# Patient Record
Sex: Female | Born: 1985 | Race: Black or African American | Hispanic: No | Smoking: Current every day smoker
Health system: Southern US, Community
[De-identification: ages and names within clinical notes are randomized; demographics above are authoritative.]

## PROBLEM LIST (undated history)

## (undated) ENCOUNTER — Inpatient Hospital Stay (HOSPITAL_COMMUNITY): Payer: Self-pay

## (undated) DIAGNOSIS — K219 Gastro-esophageal reflux disease without esophagitis: Secondary | ICD-10-CM

## (undated) DIAGNOSIS — F32A Depression, unspecified: Secondary | ICD-10-CM

## (undated) DIAGNOSIS — F329 Major depressive disorder, single episode, unspecified: Secondary | ICD-10-CM

## (undated) DIAGNOSIS — D573 Sickle-cell trait: Secondary | ICD-10-CM

---
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
## 2010-05-20 ENCOUNTER — Emergency Department (HOSPITAL_COMMUNITY)
Admission: EM | Admit: 2010-05-20 | Discharge: 2010-05-20 | Payer: Self-pay | Source: Home / Self Care | Admitting: Emergency Medicine

## 2011-01-06 ENCOUNTER — Emergency Department (HOSPITAL_COMMUNITY)
Admission: EM | Admit: 2011-01-06 | Discharge: 2011-01-06 | Disposition: A | Discharge status: Home / Self Care | Payer: Medicaid Other | Attending: Emergency Medicine | Admitting: Emergency Medicine

## 2011-01-06 DIAGNOSIS — M545 Low back pain, unspecified: Secondary | ICD-10-CM | POA: Insufficient documentation

## 2011-01-06 DIAGNOSIS — M79609 Pain in unspecified limb: Secondary | ICD-10-CM | POA: Insufficient documentation

## 2011-01-06 DIAGNOSIS — G8929 Other chronic pain: Secondary | ICD-10-CM | POA: Insufficient documentation

## 2011-01-15 ENCOUNTER — Emergency Department (HOSPITAL_COMMUNITY)
Admission: EM | Admit: 2011-01-15 | Discharge: 2011-01-15 | Disposition: A | Discharge status: Home / Self Care | Payer: Medicaid Other | Attending: Emergency Medicine | Admitting: Emergency Medicine

## 2011-01-15 DIAGNOSIS — K089 Disorder of teeth and supporting structures, unspecified: Secondary | ICD-10-CM | POA: Insufficient documentation

## 2011-01-15 DIAGNOSIS — K029 Dental caries, unspecified: Secondary | ICD-10-CM | POA: Insufficient documentation

## 2011-05-20 ENCOUNTER — Emergency Department (HOSPITAL_COMMUNITY)
Admission: EM | Admit: 2011-05-20 | Discharge: 2011-05-20 | Discharge status: Against Medical Advice | Payer: Medicaid Other | Attending: Emergency Medicine | Admitting: Emergency Medicine

## 2011-05-20 ENCOUNTER — Encounter: Payer: Self-pay | Admitting: Emergency Medicine

## 2011-05-20 DIAGNOSIS — R110 Nausea: Secondary | ICD-10-CM | POA: Insufficient documentation

## 2011-05-20 LAB — URINALYSIS, ROUTINE W REFLEX MICROSCOPIC
Bilirubin Urine: NEGATIVE
Glucose, UA: NEGATIVE mg/dL
Hgb urine dipstick: NEGATIVE
Specific Gravity, Urine: 1.012 (ref 1.005–1.030)
pH: 6.5 (ref 5.0–8.0)

## 2011-05-20 LAB — POCT PREGNANCY, URINE: Preg Test, Ur: POSITIVE

## 2011-05-20 NOTE — ED Notes (Signed)
Pt c/o nausea this am and wants to be checked to see if she is pregnant; pt sts LMP was 04/12/2011

## 2011-05-26 NOTE — L&D Delivery Note (Signed)
Delivery Note At  a viable female was delivered via precipitous NSVD (Presentation: LOA ;  ).  APGAR: 9, 9; weight pending.   Placenta status: spontaneous, intact.  Cord: 3-vessel cord with the following complications: none.  Cord pH: n/a  Anesthesia:  none Episiotomy: n/a Lacerations: none Suture Repair: n/a Est. Blood Loss (mL): 300  Mom to postpartum.  Baby to nursery-stable.  Maylon Cos, CNM was present for delivery.  BOOTH, Deondra Wigger 01/04/2012, 1:43 AM

## 2011-05-26 NOTE — L&D Delivery Note (Signed)
I was present and agree with the above. Camdon Saetern E.  

## 2011-07-20 ENCOUNTER — Other Ambulatory Visit (HOSPITAL_COMMUNITY): Payer: Self-pay | Admitting: Family

## 2011-07-20 DIAGNOSIS — Z3689 Encounter for other specified antenatal screening: Secondary | ICD-10-CM

## 2011-07-20 LAB — OB RESULTS CONSOLE HIV ANTIBODY (ROUTINE TESTING): HIV: NONREACTIVE

## 2011-07-20 LAB — OB RESULTS CONSOLE GC/CHLAMYDIA
Chlamydia: NEGATIVE
Gonorrhea: NEGATIVE

## 2011-07-22 ENCOUNTER — Other Ambulatory Visit (HOSPITAL_COMMUNITY): Payer: Self-pay | Admitting: Family

## 2011-07-22 ENCOUNTER — Encounter (HOSPITAL_COMMUNITY): Payer: Self-pay

## 2011-07-22 ENCOUNTER — Ambulatory Visit (HOSPITAL_COMMUNITY)
Admission: RE | Admit: 2011-07-22 | Discharge: 2011-07-22 | Disposition: A | Discharge status: Home / Self Care | Payer: Medicaid Other | Source: Ambulatory Visit | Attending: Family | Admitting: Family

## 2011-07-22 DIAGNOSIS — Z3689 Encounter for other specified antenatal screening: Secondary | ICD-10-CM

## 2011-07-29 ENCOUNTER — Other Ambulatory Visit (HOSPITAL_COMMUNITY): Payer: Self-pay | Admitting: Family

## 2011-07-29 DIAGNOSIS — Z3689 Encounter for other specified antenatal screening: Secondary | ICD-10-CM

## 2011-08-12 ENCOUNTER — Ambulatory Visit (HOSPITAL_COMMUNITY)
Admission: RE | Admit: 2011-08-12 | Discharge: 2011-08-12 | Disposition: A | Discharge status: Home / Self Care | Payer: Medicare Other | Source: Ambulatory Visit | Attending: Family | Admitting: Family

## 2011-08-12 DIAGNOSIS — Z1389 Encounter for screening for other disorder: Secondary | ICD-10-CM | POA: Insufficient documentation

## 2011-08-12 DIAGNOSIS — O358XX0 Maternal care for other (suspected) fetal abnormality and damage, not applicable or unspecified: Secondary | ICD-10-CM | POA: Insufficient documentation

## 2011-08-12 DIAGNOSIS — Z3689 Encounter for other specified antenatal screening: Secondary | ICD-10-CM

## 2011-08-12 DIAGNOSIS — Z363 Encounter for antenatal screening for malformations: Secondary | ICD-10-CM | POA: Insufficient documentation

## 2011-09-27 ENCOUNTER — Inpatient Hospital Stay (HOSPITAL_COMMUNITY)
Admission: AD | Admit: 2011-09-27 | Discharge: 2011-09-27 | Disposition: A | Discharge status: Home / Self Care | Payer: Medicare Other | Attending: Family Medicine | Admitting: Family Medicine

## 2011-09-27 ENCOUNTER — Encounter (HOSPITAL_COMMUNITY): Payer: Self-pay

## 2011-09-27 DIAGNOSIS — O26899 Other specified pregnancy related conditions, unspecified trimester: Secondary | ICD-10-CM

## 2011-09-27 DIAGNOSIS — M549 Dorsalgia, unspecified: Secondary | ICD-10-CM

## 2011-09-27 DIAGNOSIS — M545 Low back pain, unspecified: Secondary | ICD-10-CM | POA: Insufficient documentation

## 2011-09-27 DIAGNOSIS — R109 Unspecified abdominal pain: Secondary | ICD-10-CM | POA: Insufficient documentation

## 2011-09-27 DIAGNOSIS — O368190 Decreased fetal movements, unspecified trimester, not applicable or unspecified: Secondary | ICD-10-CM | POA: Insufficient documentation

## 2011-09-27 DIAGNOSIS — O99891 Other specified diseases and conditions complicating pregnancy: Secondary | ICD-10-CM | POA: Insufficient documentation

## 2011-09-27 HISTORY — DX: Sickle-cell trait: D57.3

## 2011-09-27 HISTORY — DX: Gastro-esophageal reflux disease without esophagitis: K21.9

## 2011-09-27 HISTORY — DX: Depression, unspecified: F32.A

## 2011-09-27 HISTORY — DX: Major depressive disorder, single episode, unspecified: F32.9

## 2011-09-27 LAB — URINALYSIS, ROUTINE W REFLEX MICROSCOPIC
Ketones, ur: NEGATIVE mg/dL
Nitrite: NEGATIVE
Specific Gravity, Urine: 1.02 (ref 1.005–1.030)
pH: 6 (ref 5.0–8.0)

## 2011-09-27 LAB — URINE MICROSCOPIC-ADD ON

## 2011-09-27 MED ORDER — CYCLOBENZAPRINE HCL 10 MG PO TABS: 10.0000 mg | ORAL_TABLET | Freq: Three times a day (TID) | ORAL | Status: AC | PRN

## 2011-09-27 NOTE — Discharge Instructions (Signed)

## 2011-09-27 NOTE — MAU Provider Note (Signed)
  History     CSN: [id number]  Arrival date and time: 09/27/11 0108   First Provider Initiated Contact with Patient 09/27/11 [phone]      Chief Complaint  Patient presents with  . Abdominal Pain   HPI  Pt here with report of lower back pain that radiated to lower left side.  Denies vaginal bleeding or contractions.  Pain is no longer present.  Also, concerned because of decreased fetal movement.  Feeling movement since arrival.    Past Medical History  Diagnosis Date  . Asthma   . Sickle cell trait   . Abnormal Pap smear   . Depression   . GERD (gastroesophageal reflux disease)     History reviewed. No pertinent past surgical history.  History reviewed. No pertinent family history.  History  Substance Use Topics  . Smoking status: Current Everyday Smoker  . Smokeless tobacco: Not on file  . Alcohol Use: No    Allergies: No Known Allergies  Prescriptions prior to admission  Medication Sig Dispense Refill  . Prenatal Vit-Fe Fumarate-FA (PRENATAL MULTIVITAMIN) TABS Take 1 tablet by mouth daily.        Review of Systems  Gastrointestinal: Positive for abdominal pain.  Musculoskeletal: Positive for back pain.  All other systems reviewed and are negative.   Physical Exam   Blood pressure 109/44, pulse 72, temperature 98.5 F (36.9 C), temperature source Oral, resp. rate 18.  Physical Exam  Constitutional: She is oriented to person, place, and time. She appears well-developed and well-nourished. No distress.  HENT:  Head: Normocephalic.  Neck: Normal range of motion. Neck supple.  Cardiovascular: Normal rate, regular rhythm and normal heart sounds.   Respiratory: Effort normal and breath sounds normal. No respiratory distress.  GI: Soft. There is no tenderness.  Genitourinary: No bleeding around the vagina. Vaginal discharge (mucusy) found.  Neurological: She is alert and oriented to person, place, and time.  Skin: Skin is warm and dry.  Cervix - closed  MAU  Course  Procedures  Results for orders placed during the hospital encounter of 09/27/11 (from the past 24 hour(s))  URINALYSIS, ROUTINE W REFLEX MICROSCOPIC     Status: Abnormal   Collection Time   09/27/11  1:10 AM      Component Value Range   Color, Urine YELLOW  YELLOW    APPearance CLEAR  CLEAR    Specific Gravity, Urine 1.020  1.005 - 1.030    pH 6.0  5.0 - 8.0    Glucose, UA NEGATIVE  NEGATIVE (mg/dL)   Hgb urine dipstick NEGATIVE  NEGATIVE    Bilirubin Urine NEGATIVE  NEGATIVE    Ketones, ur NEGATIVE  NEGATIVE (mg/dL)   Protein, ur NEGATIVE  NEGATIVE (mg/dL)   Urobilinogen, UA 0.2  0.0 - 1.0 (mg/dL)   Nitrite NEGATIVE  NEGATIVE    Leukocytes, UA TRACE (*) NEGATIVE   URINE MICROSCOPIC-ADD ON     Status: Normal   Collection Time   09/27/11  1:10 AM      Component Value Range   Squamous Epithelial / LPF RARE  RARE    WBC, UA 3-6  <3 (WBC/hpf)   Bacteria, UA RARE  RARE    FHR 140's  Assessment and Plan  Normal Examination Reassuring Fetal Heart Rate  Plan: DC to home Preterm labor precautions RX Flexeril for back pain  [facility] 09/27/2011, 2:07 AM

## 2011-09-27 NOTE — MAU Provider Note (Signed)
Chart reviewed and agree with management and plan.  

## 2011-09-27 NOTE — MAU Note (Signed)
Patient is here with c/o lower back and llq pain. She denies any vaginal bleeding or lof. She reports decreased fetal movement

## 2011-11-27 ENCOUNTER — Inpatient Hospital Stay (HOSPITAL_COMMUNITY)
Admission: AD | Admit: 2011-11-27 | Discharge: 2011-11-28 | Disposition: A | Discharge status: Home / Self Care | Payer: Medicaid Other | Source: Ambulatory Visit | Attending: Obstetrics & Gynecology | Admitting: Obstetrics & Gynecology

## 2011-11-27 DIAGNOSIS — O2390 Unspecified genitourinary tract infection in pregnancy, unspecified trimester: Secondary | ICD-10-CM | POA: Insufficient documentation

## 2011-11-27 DIAGNOSIS — L539 Erythematous condition, unspecified: Secondary | ICD-10-CM | POA: Insufficient documentation

## 2011-11-27 DIAGNOSIS — R32 Unspecified urinary incontinence: Secondary | ICD-10-CM | POA: Insufficient documentation

## 2011-11-27 DIAGNOSIS — B373 Candidiasis of vulva and vagina: Secondary | ICD-10-CM

## 2011-11-27 DIAGNOSIS — O99891 Other specified diseases and conditions complicating pregnancy: Secondary | ICD-10-CM | POA: Insufficient documentation

## 2011-11-27 DIAGNOSIS — M549 Dorsalgia, unspecified: Secondary | ICD-10-CM

## 2011-11-27 DIAGNOSIS — B9689 Other specified bacterial agents as the cause of diseases classified elsewhere: Secondary | ICD-10-CM | POA: Insufficient documentation

## 2011-11-27 DIAGNOSIS — M545 Low back pain, unspecified: Secondary | ICD-10-CM | POA: Insufficient documentation

## 2011-11-27 DIAGNOSIS — R238 Other skin changes: Secondary | ICD-10-CM

## 2011-11-27 DIAGNOSIS — A499 Bacterial infection, unspecified: Secondary | ICD-10-CM | POA: Insufficient documentation

## 2011-11-27 DIAGNOSIS — N760 Acute vaginitis: Secondary | ICD-10-CM | POA: Insufficient documentation

## 2011-11-28 ENCOUNTER — Encounter (HOSPITAL_COMMUNITY): Payer: Self-pay | Admitting: Family Medicine

## 2011-11-28 DIAGNOSIS — O26899 Other specified pregnancy related conditions, unspecified trimester: Secondary | ICD-10-CM

## 2011-11-28 DIAGNOSIS — M549 Dorsalgia, unspecified: Secondary | ICD-10-CM

## 2011-11-28 LAB — WET PREP, GENITAL: Trich, Wet Prep: NONE SEEN

## 2011-11-28 MED ORDER — FLUCONAZOLE 150 MG PO TABS: 150.0000 mg | ORAL_TABLET | Freq: Once | ORAL | Status: AC

## 2011-11-28 MED ORDER — METRONIDAZOLE 500 MG PO TABS: 500.0000 mg | ORAL_TABLET | Freq: Two times a day (BID) | ORAL | Status: AC

## 2011-11-28 MED ORDER — CYCLOBENZAPRINE HCL 10 MG PO TABS: 10.0000 mg | ORAL_TABLET | Freq: Two times a day (BID) | ORAL | Status: AC | PRN

## 2011-11-28 NOTE — MAU Note (Signed)
During the day sometimes I leak fld. Have rash around rectum. Unsure if I'm sweating or leaking fld. This has been going on 3-4wks. Some lower back

## 2011-11-28 NOTE — MAU Provider Note (Signed)
History  CSN: [id number] Arrival date and time: 11/27/11 2354  Chief Complaint  Patient presents with  . Rupture of Membranes  . Back Pain   HPI Patient is a 26 yo woman, [redacted]w[redacted]d by LMP, [id number] who presents with right lower back pain, diffuse aching abdominal pain that has progressed with continuation of pregnancy, a rash in her upper inner thigh and vaginal area and complaints of a slow, clear vaginal discharge over the past few days that worsens when she coughs. Patient reports that she has not had pain like this before with other pregnancies and it is very uncomfortable. She was given a prescription for a muscle relaxer from the HD however she never filled it. She also notes that she was shaving her vaginal area yesterday and began developing a rash that was tender to the touch. She cannot see it however her partner reports it looks like a diaper rash. Patient denies any changes in soaps, new lotions / creams, no new medications. The patient has urinary frequency about every 30-40 min. and notes a small, clear discharge for which she has been wearing a pad for lately, however it "does not smell like urine." She also reports that the leak increases when she coughs. She denies any fevers, chills, night sweats, N/V/D, constipation. She receives her prenatal care at [facility].    OB History    Grav Para Term Preterm Abortions TAB SAB Ect Mult Living   3 2 2       2       Past Medical History  Diagnosis Date  . Asthma   . Sickle cell trait   . Abnormal Pap smear   . Depression   . GERD (gastroesophageal reflux disease)     No past surgical history on file.  No family history on file.  History  Substance Use Topics  . Smoking status: Former Games developer  . Smokeless tobacco: Not on file  . Alcohol Use: No    Allergies: No Known Allergies  Prescriptions prior to admission  Medication Sig Dispense Refill  . Prenatal Vit-Fe Fumarate-FA (PRENATAL MULTIVITAMIN) TABS Take 1 tablet by mouth daily.         ROS   General: negative for - chills, fever or night sweats Psychological ROS: positive for - depression Ophthalmic ROS: negative ENT ROS: negative Allergy and Immunology ROS: negative for - hives or itchy/watery eyes Hematological and Lymphatic ROS: negative for - bleeding problems or night sweats Endocrine ROS: negative Respiratory ROS: no cough, shortness of breath, or wheezing Cardiovascular ROS: no chest pain or dyspnea on exertion Gastrointestinal ROS: positive for - abdominal pain Genito-Urinary ROS: positive for - incontinence Musculoskeletal ROS: negative Neurological ROS: no TIA or stroke symptoms Dermatological ROS: right paralabial erythema   Blood pressure 101/52, pulse 72, temperature 98.5 F (36.9 C), temperature source Oral, resp. rate 20, height 5\' 7"  (1.702 m), weight 114.306 kg (252 lb).  Physical Exam General appearance - alert, well appearing, and in no distress Mental status - alert, oriented to person, place, and time Eyes - extraocular eye movements intact Mouth - mucous membranes moist, pharynx normal without lesions Chest - clear to auscultation, no wheezes, rales or rhonchi, symmetric air entry Heart - normal rate, regular rhythm, normal S1, S2, no murmurs, rubs, clicks or gallops Abdomen - soft, nontender, nondistended, no masses or organomegaly Pelvic - white vaginal discharge, normal external genitalia Back exam - full range of motion, no tenderness, palpable spasm or pain on motion Neurological - alert, oriented,  normal speech, no focal findings or movement disorder noted Musculoskeletal - no joint tenderness, deformity or swelling Extremities - pedal edema 1 + B/L LE Skin - right paralabial erythema  Results for orders placed during the hospital encounter of 11/27/11 (from the past 24 hour(s))  WET PREP, GENITAL     Status: Abnormal   Collection Time   11/28/11  1:25 AM      Component Value Range   Yeast Wet Prep HPF POC FEW (*) NONE  SEEN   Trich, Wet Prep NONE SEEN  NONE SEEN   Clue Cells Wet Prep HPF POC FEW (*) NONE SEEN   WBC, Wet Prep HPF POC FEW (*) NONE SEEN   MAU Course  Procedures - sterile spec exam - wet prep test   MDM - discussed findings of wet prep with patient - no s/s of ROM seen on exam, no pooling  Assessment  1) Lower back pain of pregnancy 2) Genital erythema 2/2 razor burn 3) Urinary incontinence - no sings of ROM, no pooling 4) Mucous, white vaginal discharge  Plan: 1) Flexeril prn for back pain 2) Continue vaseline for razor burn 3) Flagyl for BV 4) Fluconazole for Candida 5) Continue prenatal care with Theone Murdoch 11/28/2011, 1:15 AM   Patient seen and examined in conjunction with Lewie Chamber, MS3.  Agree with above note.  NST - category 1 tracing with multiple accelerations seen.  Levie Heritage, DO 11/28/2011 1:54 AM

## 2011-12-25 LAB — OB RESULTS CONSOLE GBS: GBS: NEGATIVE

## 2012-01-02 ENCOUNTER — Inpatient Hospital Stay (HOSPITAL_COMMUNITY)
Admission: AD | Admit: 2012-01-02 | Discharge: 2012-01-02 | DRG: 780 | Disposition: A | Discharge status: Home / Self Care | Payer: Medicare Other | Source: Ambulatory Visit | Attending: Obstetrics & Gynecology | Admitting: Obstetrics & Gynecology

## 2012-01-02 ENCOUNTER — Encounter (HOSPITAL_COMMUNITY): Payer: Self-pay | Admitting: *Deleted

## 2012-01-02 DIAGNOSIS — IMO0001 Reserved for inherently not codable concepts without codable children: Secondary | ICD-10-CM

## 2012-01-02 DIAGNOSIS — O479 False labor, unspecified: Principal | ICD-10-CM | POA: Diagnosis present

## 2012-01-02 LAB — CBC
HCT: 35.1 % — ABNORMAL LOW (ref 36.0–46.0)
Hemoglobin: 12.2 g/dL (ref 12.0–15.0)
WBC: 10.7 10*3/uL — ABNORMAL HIGH (ref 4.0–10.5)

## 2012-01-02 MED ORDER — OXYTOCIN 40 UNITS IN LACTATED RINGERS INFUSION - SIMPLE MED: 62.5000 mL/h | Freq: Once | INTRAVENOUS | Status: DC

## 2012-01-02 MED ORDER — FENTANYL CITRATE 0.05 MG/ML IJ SOLN
100.0000 ug | Freq: Once | INTRAMUSCULAR | Status: AC
  Administered 2012-01-02: 100 ug via INTRAVENOUS
  Filled 2012-01-02: qty 2

## 2012-01-02 MED ORDER — OXYTOCIN BOLUS FROM INFUSION
250.0000 mL | Freq: Once | INTRAVENOUS | Status: DC
  Filled 2012-01-02: qty 500

## 2012-01-02 MED ORDER — EPHEDRINE 5 MG/ML INJ: 10.0000 mg | INTRAVENOUS | Status: DC | PRN

## 2012-01-02 MED ORDER — FENTANYL 2.5 MCG/ML BUPIVACAINE 1/10 % EPIDURAL INFUSION (WH - ANES): 14.0000 mL/h | INTRAMUSCULAR | Status: DC

## 2012-01-02 MED ORDER — ONDANSETRON HCL 4 MG/2ML IJ SOLN: 4.0000 mg | Freq: Four times a day (QID) | INTRAMUSCULAR | Status: DC | PRN

## 2012-01-02 MED ORDER — OXYCODONE-ACETAMINOPHEN 5-325 MG PO TABS: 1.0000 | ORAL_TABLET | ORAL | Status: DC | PRN

## 2012-01-02 MED ORDER — PHENYLEPHRINE 40 MCG/ML (10ML) SYRINGE FOR IV PUSH (FOR BLOOD PRESSURE SUPPORT): 80.0000 ug | PREFILLED_SYRINGE | INTRAVENOUS | Status: DC | PRN

## 2012-01-02 MED ORDER — ACETAMINOPHEN 325 MG PO TABS: 650.0000 mg | ORAL_TABLET | ORAL | Status: DC | PRN

## 2012-01-02 MED ORDER — LIDOCAINE HCL (PF) 1 % IJ SOLN: 30.0000 mL | INTRAMUSCULAR | Status: DC | PRN

## 2012-01-02 MED ORDER — LACTATED RINGERS IV SOLN
500.0000 mL | INTRAVENOUS | Status: DC | PRN
Start: 2012-01-02 — End: 2012-01-02
  Administered 2012-01-02: 1000 mL via INTRAVENOUS

## 2012-01-02 MED ORDER — CITRIC ACID-SODIUM CITRATE 334-500 MG/5ML PO SOLN: 30.0000 mL | ORAL | Status: DC | PRN

## 2012-01-02 MED ORDER — DIPHENHYDRAMINE HCL 50 MG/ML IJ SOLN: 12.5000 mg | INTRAMUSCULAR | Status: DC | PRN

## 2012-01-02 MED ORDER — LACTATED RINGERS IV SOLN: INTRAVENOUS | Status: DC

## 2012-01-02 MED ORDER — IBUPROFEN 600 MG PO TABS: 600.0000 mg | ORAL_TABLET | Freq: Four times a day (QID) | ORAL | Status: DC | PRN

## 2012-01-02 MED ORDER — FLEET ENEMA 7-19 GM/118ML RE ENEM: 1.0000 | ENEMA | RECTAL | Status: DC | PRN

## 2012-01-02 MED ORDER — LACTATED RINGERS IV SOLN: 500.0000 mL | Freq: Once | INTRAVENOUS | Status: DC

## 2012-01-02 NOTE — Plan of Care (Signed)
Problem: Phase I Progression Outcomes Goal: Discharge home if all goals are met Outcome: Not Progressing Pt discharged due to lack of cervical change and resolution of contractions per Dr. Doreene Nest and Dr. Jacalyn Lefevre.Marland Kitchen

## 2012-01-02 NOTE — Progress Notes (Signed)
Samantha Bradford is a 26 y.o. G3P2002 at [redacted]w[redacted]d admitted for labor  Subjective:   Objective: BP 114/72  Pulse 69  Temp 98.2 F (36.8 C) (Oral)  Resp 18  Ht 5\' 5"  (1.651 m)  Wt 113.399 kg (250 lb)  BMI 41.60 kg/m2      FHT:  FHR: 130 bpm, variability: moderate,  accelerations:  Present,  decelerations:  Absent UC:   irregular, every 4-7 minutes SVE:   5-6/70/-2 Labs: No results found for this basename: WBC, HGB, HCT, MCV, PLT    Assessment / Plan: spontaneous labor, contractions not frequent but pt almost 6 cm.  Labor: Progressing normally Preeclampsia:  no signs or symptoms of toxicity Fetal Wellbeing:  Category I Pain Control:  Fentanyl I/D:  n/a Anticipated MOD:  NSVD  LEGGETT,KELLY H. 01/02/2012, 4:10 PM

## 2012-01-02 NOTE — H&P (Signed)
I examined pt and agree with documentation above and resident plan of care. MUHAMMAD,Rubby Barbary  

## 2012-01-02 NOTE — Discharge Summary (Signed)
Pt seen and examined. Agree with above note.  Khoi Hamberger H. 01/02/2012 9:08 PM

## 2012-01-02 NOTE — H&P (Signed)
Samantha Bradford is a 26 y.o. female presenting for contractions x3 days, felt in bottom. Maternal Medical History:  Reason for admission: Reason for admission: contractions.  Reason for Admission:   nauseaContractions: Onset was more than 2 days ago.   Frequency: regular.   Perceived severity is strong.    Fetal activity: Perceived fetal activity is normal.   Last perceived fetal movement was within the past hour.    Prenatal complications: Substance abuse (marijuana and cigarettes in 1st trimester).   No bleeding, hypertension or preterm labor.   Prenatal Complications - Diabetes: none.    OB History    Grav Para Term Preterm Abortions TAB SAB Ect Mult Living   3 2 2       2      Past Medical History  Diagnosis Date  . Asthma   . Sickle cell trait   . Abnormal Pap smear   . Depression   . GERD (gastroesophageal reflux disease)    History reviewed. No pertinent past surgical history. Family History: family history is not on file. Social History:  reports that she has quit smoking. She does not have any smokeless tobacco history on file. She reports that she uses illicit drugs (Marijuana). She reports that she does not drink alcohol.   Prenatal Transfer Tool  Maternal Diabetes: No Genetic Screening: Normal Maternal Ultrasounds/Referrals: Normal Fetal Ultrasounds or other Referrals:  None Maternal Substance Abuse:  Yes:  Type: Smoker, Marijuana - in first trimester only Significant Maternal Medications:  None Significant Maternal Lab Results:  Lab values include: Group B Strep negative Other Comments:  None  Review of Systems  Constitutional: Negative for fever and chills.  Respiratory: Negative for cough and shortness of breath.   Cardiovascular: Negative for chest pain.  Gastrointestinal: Negative for nausea and vomiting.  Neurological: Negative for dizziness and headaches.    Dilation: 5 Effacement (%): 80 Station: -3 Exam by:: Elwyn Reach MD Blood pressure  100/59, pulse 82, temperature 98.2 F (36.8 C), resp. rate 18, height 5\' 6"  (1.676 m), weight 250 lb 3.2 oz (113.49 kg). Maternal Exam:  Uterine Assessment: Contraction strength is moderate.  Abdomen: Patient reports no abdominal tenderness. Fundal height is appropriate for dates.   Estimated fetal weight is 8.   Fetal presentation: vertex  Introitus: Normal vulva. Normal vagina.  Pelvis: adequate for delivery.   Cervix: Cervix evaluated by digital exam.     Fetal Exam Fetal Monitor Review: Mode: ultrasound.   Baseline rate: 135.  Variability: moderate (6-25 bpm).   Pattern: accelerations present and no decelerations.    Fetal State Assessment: Category I - tracings are normal.     Physical Exam  Constitutional: She is oriented to person, place, and time. She appears well-developed and well-nourished. She appears distressed (with contractions).  HENT:  Head: Normocephalic and atraumatic.  Eyes: No scleral icterus.  Neck: Normal range of motion. Neck supple.  Cardiovascular: Normal rate.   Respiratory: Effort normal.  GI:       Gravid  Musculoskeletal: She exhibits no edema and no tenderness.  Neurological: She is alert and oriented to person, place, and time. She exhibits normal muscle tone.  Skin: Skin is warm and dry. No erythema.  Psychiatric: She has a normal mood and affect. Her behavior is normal.    Prenatal labs: ABO, Rh: O/Positive/-- (02/25 0000) Antibody:   neg Rubella:  immune RPR:   negative HBsAg:   negative HIV:   negative GBS:   negative 1hr GTT: 100  Assessment/Plan:  26 yo G3P2002 in active labor - admit to L&D - GBS negative - plans on epidural - provide labor support   BOOTH, ERIN 01/02/2012, 3:02 PM

## 2012-01-02 NOTE — Discharge Summary (Signed)
Obstetric Discharge Summary Samantha Bradford is a 26 yo G3P2002 at [redacted]w[redacted]d who presented to MAU with contractions.  Reason for Admission: contractions Prenatal Procedures: ultrasound Hospital course: Patient was admitted after cervix changed from 4 to 5 cm.  She was started on IV fluids and her contractions stopped.  After 3 hours of monitoring, her contractions did not resume and her cervix remained 5-5.5cm and posterior.  She was discharged with labor precautions and instructions to stay well hydrated.  Discharge Diagnosis: Braxton-Hicks contractions Discharge condition: Stable  Discharge Information: Date: 01/02/2012 Activity: unrestricted Diet: routine Medications: PNV Condition: stable Instructions: labor precautions and hydration Discharge to: home   BOOTH, Gailene Youkhana 01/02/2012, 7:31 PM

## 2012-01-02 NOTE — MAU Note (Signed)
Pt reports  She has been leaking clear fluid foraa few days and having "strong contractions" also. About 3-8 min apart. Pt told she was 5 cm dilated on Thursday.

## 2012-01-04 ENCOUNTER — Encounter (HOSPITAL_COMMUNITY): Payer: Self-pay | Admitting: *Deleted

## 2012-01-04 ENCOUNTER — Inpatient Hospital Stay (HOSPITAL_COMMUNITY)
Admission: AD | Admit: 2012-01-04 | Discharge: 2012-01-05 | DRG: 775 | Disposition: A | Discharge status: Home / Self Care | Payer: Medicare Other | Source: Ambulatory Visit | Attending: Obstetrics & Gynecology | Admitting: Obstetrics & Gynecology

## 2012-01-04 LAB — CBC
MCH: 27.9 pg (ref 26.0–34.0)
MCHC: 34.9 g/dL (ref 30.0–36.0)
Platelets: 221 10*3/uL (ref 150–400)
RDW: 13.4 % (ref 11.5–15.5)

## 2012-01-04 MED ORDER — SENNOSIDES-DOCUSATE SODIUM 8.6-50 MG PO TABS
2.0000 | ORAL_TABLET | Freq: Every day | ORAL | Status: DC
  Administered 2012-01-04: 2 via ORAL

## 2012-01-04 MED ORDER — OXYTOCIN BOLUS FROM INFUSION
250.0000 mL | Freq: Once | INTRAVENOUS | Status: DC
  Filled 2012-01-04: qty 500

## 2012-01-04 MED ORDER — PRENATAL MULTIVITAMIN CH
1.0000 | ORAL_TABLET | Freq: Every day | ORAL | Status: DC
  Administered 2012-01-04 – 2012-01-05 (×2): 1 via ORAL
  Filled 2012-01-04 (×2): qty 1

## 2012-01-04 MED ORDER — LACTATED RINGERS IV SOLN: INTRAVENOUS | Status: DC

## 2012-01-04 MED ORDER — ZOLPIDEM TARTRATE 5 MG PO TABS: 5.0000 mg | ORAL_TABLET | Freq: Every evening | ORAL | Status: DC | PRN

## 2012-01-04 MED ORDER — IBUPROFEN 600 MG PO TABS
600.0000 mg | ORAL_TABLET | Freq: Four times a day (QID) | ORAL | Status: DC
  Administered 2012-01-04 – 2012-01-05 (×6): 600 mg via ORAL
  Filled 2012-01-04 (×5): qty 1

## 2012-01-04 MED ORDER — LACTATED RINGERS IV SOLN: 500.0000 mL | INTRAVENOUS | Status: DC | PRN

## 2012-01-04 MED ORDER — OXYTOCIN 40 UNITS IN LACTATED RINGERS INFUSION - SIMPLE MED: 62.5000 mL/h | Freq: Once | INTRAVENOUS | Status: DC

## 2012-01-04 MED ORDER — ONDANSETRON HCL 4 MG PO TABS: 4.0000 mg | ORAL_TABLET | ORAL | Status: DC | PRN

## 2012-01-04 MED ORDER — IBUPROFEN 600 MG PO TABS
600.0000 mg | ORAL_TABLET | Freq: Four times a day (QID) | ORAL | Status: DC | PRN
  Administered 2012-01-04: 600 mg via ORAL
  Filled 2012-01-04: qty 1

## 2012-01-04 MED ORDER — WITCH HAZEL-GLYCERIN EX PADS: 1.0000 "application " | MEDICATED_PAD | CUTANEOUS | Status: DC | PRN

## 2012-01-04 MED ORDER — METOCLOPRAMIDE HCL 10 MG PO TABS: 10.0000 mg | ORAL_TABLET | Freq: Once | ORAL | Status: DC

## 2012-01-04 MED ORDER — FLEET ENEMA 7-19 GM/118ML RE ENEM: 1.0000 | ENEMA | RECTAL | Status: DC | PRN

## 2012-01-04 MED ORDER — BENZOCAINE-MENTHOL 20-0.5 % EX AERO: 1.0000 "application " | INHALATION_SPRAY | CUTANEOUS | Status: DC | PRN

## 2012-01-04 MED ORDER — ACETAMINOPHEN 325 MG PO TABS: 650.0000 mg | ORAL_TABLET | ORAL | Status: DC | PRN

## 2012-01-04 MED ORDER — DIBUCAINE 1 % RE OINT: 1.0000 "application " | TOPICAL_OINTMENT | RECTAL | Status: DC | PRN

## 2012-01-04 MED ORDER — CITRIC ACID-SODIUM CITRATE 334-500 MG/5ML PO SOLN: 30.0000 mL | ORAL | Status: DC | PRN

## 2012-01-04 MED ORDER — OXYCODONE-ACETAMINOPHEN 5-325 MG PO TABS
1.0000 | ORAL_TABLET | ORAL | Status: DC | PRN
  Administered 2012-01-04: 1 via ORAL
  Filled 2012-01-04: qty 1

## 2012-01-04 MED ORDER — LIDOCAINE HCL (PF) 1 % IJ SOLN: 30.0000 mL | INTRAMUSCULAR | Status: DC | PRN

## 2012-01-04 MED ORDER — DIPHENHYDRAMINE HCL 25 MG PO CAPS: 25.0000 mg | ORAL_CAPSULE | Freq: Four times a day (QID) | ORAL | Status: DC | PRN

## 2012-01-04 MED ORDER — LANOLIN HYDROUS EX OINT: TOPICAL_OINTMENT | CUTANEOUS | Status: DC | PRN

## 2012-01-04 MED ORDER — SIMETHICONE 80 MG PO CHEW: 80.0000 mg | CHEWABLE_TABLET | ORAL | Status: DC | PRN

## 2012-01-04 MED ORDER — ONDANSETRON HCL 4 MG/2ML IJ SOLN: 4.0000 mg | INTRAMUSCULAR | Status: DC | PRN

## 2012-01-04 MED ORDER — FAMOTIDINE 20 MG PO TABS: 40.0000 mg | ORAL_TABLET | Freq: Once | ORAL | Status: DC

## 2012-01-04 MED ORDER — ONDANSETRON HCL 4 MG/2ML IJ SOLN: 4.0000 mg | Freq: Four times a day (QID) | INTRAMUSCULAR | Status: DC | PRN

## 2012-01-04 MED ORDER — OXYTOCIN 10 UNIT/ML IJ SOLN
INTRAMUSCULAR | Status: AC
  Administered 2012-01-04: 10 [IU]
  Filled 2012-01-04: qty 2

## 2012-01-04 NOTE — H&P (Signed)
  Melessa Borgeson is a 26 y.o. female presenting for contractions. Maternal Medical History:  Reason for admission: Reason for admission: contractions.  Contractions: Onset was 6-12 hours ago.   Frequency: regular.   Perceived severity is strong.    Prenatal complications: no prenatal complications Prenatal Complications - Diabetes: none.    OB History    Grav Para Term Preterm Abortions TAB SAB Ect Mult Living   3 2 2       2      Past Medical History  Diagnosis Date  . Sickle cell trait   . Depression   . GERD (gastroesophageal reflux disease)    No past surgical history on file. Family History: family history includes Asthma in her brother, mother, and son and Hypertension in her mother. Social History:  reports that she has quit smoking. She has never used smokeless tobacco. She reports that she uses illicit drugs (Marijuana). She reports that she does not drink alcohol.   Prenatal Transfer Tool  Maternal Diabetes: No Genetic Screening: Normal Maternal Ultrasounds/Referrals: Normal Fetal Ultrasounds or other Referrals:  None Maternal Substance Abuse:  Yes:  Type: Smoker, Marijuana - first trimester only Significant Maternal Medications:  None Significant Maternal Lab Results:  Lab values include: Group B Strep negative Other Comments:  None  Review of Systems  Unable to perform ROS: acuity of condition    Dilation: Lip/rim Station: 0 Exam by:: Cristal Ford RN There were no vitals taken for this visit. Maternal Exam:  Uterine Assessment: Contraction strength is firm.  Contraction frequency is regular.   Abdomen: Fetal presentation: vertex  Pelvis: adequate for delivery.      Physical Exam  Constitutional: She appears well-developed and well-nourished. She appears distressed.    Prenatal labs: ABO, Rh: O/Positive/-- (02/25 0000) Antibody: Negative (02/25 0000) Rubella: Immune (02/25 0000) RPR: NON REACTIVE (08/10 1541)  HBsAg: Negative (02/25  0000)  HIV: Non-reactive (02/25 0000)  GBS: Negative (08/02 0000)   Assessment/Plan: 26 yo G3P2002 presenting to MAU in active labor at 9.5/100/0 - admit to L&D   BOOTH, Mayeli Bornhorst 01/04/2012, 1:37 AM

## 2012-01-04 NOTE — Progress Notes (Signed)
UR chart review completed.  

## 2012-01-04 NOTE — H&P (Signed)
I have seen/examined this pt and agree with the above. Dailan Pfalzgraf E.

## 2012-01-04 NOTE — Clinical Social Work Maternal (Signed)
Clinical Social Work Department PSYCHOSOCIAL ASSESSMENT - MATERNAL/CHILD 01/04/2012  Patient:  Samantha Bradford, Samantha Bradford  Account Number:  [id number]  Admit Date:  01/04/2012  Marjo Bicker Name:   AVWUJWJ Goods    Clinical Social Worker:  Lulu Riding, LCSW   Date/Time:  01/04/2012 10:40 AM  Date Referred:  01/04/2012   Referral source  CN     Referred reason  Substance Abuse   Other referral source:    I:  FAMILY / HOME ENVIRONMENT Child's legal guardian:  PARENT  Guardian - Name Guardian - Age Guardian - Address  Ieesha Abbasi 26 [address], [place], [place] [address]  Murtis Sink  unknown/does not live with MOB   Other household support members/support persons Name Relationship DOB   BROTHER 08/28/04  Semaj Goods BROTHER 11/18/08   Other support:   Good support system    II  PSYCHOSOCIAL DATA Information Source:  Family Interview  Surveyor, quantity and [employer] Employment:   Surveyor, quantity resources:  [employer] If Medicaid - County:  [employer] Other  Sales executive  WIC   School / Grade:   Maternity Care Coordinator / Child Services Coordination / Early Interventions:  Cultural issues impacting care:   none known    III  STRENGTHS Strengths  Adequate Resources  Compliance with medical plan  Home prepared for Child (including basic supplies)  Supportive family/friends   Strength comment:    IV  RISK FACTORS AND CURRENT PROBLEMS Current Problem:  None   Risk Factor & Current Problem Patient Issue Family Issue Risk Factor / Current Problem Comment   N N     V  SOCIAL WORK ASSESSMENT SW met with MOB in her first floor room/110 to complete assessment.  MOB's children, friend and friend's children were present and SW asked them to step out for a minute for privacy.  FOB came in at this point and MOB asked him to stay and said we could talk about anything with him present.  MOB appeared bonded with the infant and was upbeat and excited about having a  baby girl.  She reports that she and FOB are together and that he is involved and supportive, however, he does not reside in the home with her and her two sons.  She reports everything is going well at this time.  SW asked her about a positive THC screen that was noted in her PNR.  She states that she has not smoked since she found out she was pregnant, making her last use approximately October 2012.  She reports no intent to use again at this point.  SW explained hospital drug screen policy and parents were understanding.  FOB denies any drug use himself and they both deny any history with Child Protective Services.  SW informed them that baby's UDS is negative and SW will monitor meconium screen.  SW also asked MOB about a note in her medical record of bouts of depression.  She smiled and said when she is pregnant she has felt depressed, but is not concerned that it is an ongoing or serious condition.  SW discussed signs and symptoms of PPD and asked parents to be aware of it and for MOB to call her doctor if symptoms arise.  They agreed.  SW has no further concerns and sees no barriers to discharge.      VI SOCIAL WORK PLAN Social Work Plan  Other  Patient/Family Education   Type of pt/family education:   PPD  hospital drug  screen policy   If child protective services report - county:   If child protective services report - date:   Information/referral to community resources comment:   Other social work plan:   SW will monitor meconium drug screen results.

## 2012-01-04 NOTE — MAU Note (Signed)
Patient presented complaining of a lot of pressure and the need to push at the registration desk. SVE 9.5/100/0 intact. FHT 135. No complications with this pregnancy per patient. Patient transferred to [facility].

## 2012-01-05 ENCOUNTER — Encounter (HOSPITAL_COMMUNITY)
Admission: AD | Disposition: A | Discharge status: Home / Self Care | Payer: Self-pay | Source: Ambulatory Visit | Attending: Obstetrics & Gynecology

## 2012-01-05 ENCOUNTER — Ambulatory Visit (INDEPENDENT_AMBULATORY_CARE_PROVIDER_SITE_OTHER): Payer: Medicaid Other | Admitting: Medical

## 2012-01-05 VITALS — BP 103/66 | HR 67 | Resp 12

## 2012-01-05 DIAGNOSIS — Z3042 Encounter for surveillance of injectable contraceptive: Secondary | ICD-10-CM

## 2012-01-05 DIAGNOSIS — Z3049 Encounter for surveillance of other contraceptives: Secondary | ICD-10-CM

## 2012-01-05 LAB — SURGICAL PCR SCREEN
MRSA, PCR: POSITIVE — AB
Staphylococcus aureus: POSITIVE — AB

## 2012-01-05 SURGERY — LIGATION, FALLOPIAN TUBE, POSTPARTUM
Anesthesia: Epidural | Laterality: Bilateral

## 2012-01-05 MED ORDER — CHLORHEXIDINE GLUCONATE CLOTH 2 % EX PADS
6.0000 | MEDICATED_PAD | Freq: Every day | CUTANEOUS | Status: DC
  Administered 2012-01-05: 6 via TOPICAL

## 2012-01-05 MED ORDER — FAMOTIDINE 20 MG PO TABS: 40.0000 mg | ORAL_TABLET | Freq: Once | ORAL | Status: DC

## 2012-01-05 MED ORDER — LACTATED RINGERS IV SOLN: INTRAVENOUS | Status: DC

## 2012-01-05 MED ORDER — MUPIROCIN 2 % EX OINT
1.0000 "application " | TOPICAL_OINTMENT | Freq: Two times a day (BID) | CUTANEOUS | Status: DC
  Administered 2012-01-05: 1 via NASAL
  Filled 2012-01-05 (×2): qty 22

## 2012-01-05 MED ORDER — METOCLOPRAMIDE HCL 10 MG PO TABS: 10.0000 mg | ORAL_TABLET | Freq: Once | ORAL | Status: DC

## 2012-01-05 MED ORDER — LIDOCAINE-EPINEPHRINE (PF) 2 %-1:200000 IJ SOLN
INTRAMUSCULAR | Status: AC
  Filled 2012-01-05: qty 20

## 2012-01-05 MED ORDER — IBUPROFEN 600 MG PO TABS: 600.0000 mg | ORAL_TABLET | Freq: Four times a day (QID) | ORAL | Status: AC

## 2012-01-05 MED ORDER — SODIUM BICARBONATE 8.4 % IV SOLN
INTRAVENOUS | Status: AC
  Filled 2012-01-05: qty 50

## 2012-01-05 MED ORDER — MEDROXYPROGESTERONE ACETATE 150 MG/ML IM SUSP
150.0000 mg | Freq: Once | INTRAMUSCULAR | Status: AC
  Administered 2012-01-05: 150 mg via INTRAMUSCULAR

## 2012-01-05 NOTE — Discharge Summary (Signed)
Obstetric Discharge Summary Reason for Admission: onset of labor Prenatal Procedures: none Intrapartum Procedures: spontaneous vaginal delivery Postpartum Procedures: none Complications-Operative and Postpartum: none Hemoglobin  Date Value Range Status  01/04/2012 10.2* 12.0 - 15.0 g/dL Final     HCT  Date Value Range Status  01/04/2012 29.2* 36.0 - 46.0 % Final    Physical Exam:  General: alert and no distress Lochia: appropriate Uterine Fundus: firm Incision: N/A DVT Evaluation: No evidence of DVT seen on physical exam.  Discharge Diagnoses: Term Pregnancy-delivered  Discharge Information: Date: 01/05/2012 Activity: unrestricted Diet: routine Medications: Ibuprofen Condition: stable Instructions: refer to practice specific booklet Discharge to: home Follow-up Information    Follow up with [facility] [place]. Schedule an appointment as soon as possible for a visit in 4 weeks.   Contact information:   [address] [employer] [place] [address]          Newborn Data: Live born female  Birth Weight: 6 lb 4.5 oz (2850 g) APGAR: 9, 9  Home with mother.  HOYLE, JASON 01/05/2012, 7:29 AM   I have seen and examined pt and agree with above. After further discussion, pt desires IUD. It was explained that the optimal time for placement is 4-6 weeks post-partum. She will get a Depo shot today in clinic and follow up with the [facility] Dept for post-partum checks and IUD. Napoleon Form, MD

## 2012-01-05 NOTE — Progress Notes (Addendum)
PHONE DR. D POE IN REF TO PATIENT Samantha Bradford  ROOM 110 LAB  RESULTS  MRSA  POSITIVE AND STAPH POSITIVE  NO ORDERS AT THIS TIME.

## 2012-01-20 ENCOUNTER — Telehealth: Payer: Self-pay | Admitting: Medical

## 2012-01-20 NOTE — Telephone Encounter (Signed)
Message copied by Freddi Starr on Wed Jan 20, 2012  3:42 PM ------      Message from: Mayra Neer P      Created: Wed Jan 20, 2012  7:38 AM      Regarding: FW: Depo shot today       Please call this patient and let her know that she can get her next depo shot at the health dept.  She got first shot here before leaving hospital.  Plans for postpartum at health dept.  Depo will be cheaper there.      ----- Message -----         From: Napoleon Form, MD         Sent: 01/05/2012   7:56 AM           To: Juliette Mangle, RN, [facility] Admin Pool      Subject: Depo shot today                                          This patient is post-partum day 1 and is being discharged. She would like Depo shot today. She will f/u with Health Dept for IUD later.  Thanks,              Truitt Merle, MD

## 2012-01-20 NOTE — Telephone Encounter (Signed)
Called patient to inform her that she will receive her next depo at the [facility]. Patient states that she is aware of this and has called them already, but was unable to make an appointment at this time. She has been asked to call them back in two weeks to see if there are any new openings. I have instructed her to call the clinic if she is unable to get an appointment for her Depo within her allowable time, but to otherwise continue to follow-up with the [facility] in the future.

## 2012-02-03 ENCOUNTER — Ambulatory Visit: Payer: Medicare Other | Admitting: Obstetrics & Gynecology

## 2012-02-22 ENCOUNTER — Ambulatory Visit: Payer: Medicare Other | Admitting: Obstetrics & Gynecology

## 2012-03-01 ENCOUNTER — Emergency Department (HOSPITAL_COMMUNITY)
Admission: EM | Admit: 2012-03-01 | Discharge: 2012-03-01 | Disposition: A | Discharge status: Home / Self Care | Payer: Medicare Other | Attending: Emergency Medicine | Admitting: Emergency Medicine

## 2012-03-01 ENCOUNTER — Encounter (HOSPITAL_COMMUNITY): Payer: Self-pay | Admitting: *Deleted

## 2012-03-01 DIAGNOSIS — K219 Gastro-esophageal reflux disease without esophagitis: Secondary | ICD-10-CM | POA: Insufficient documentation

## 2012-03-01 DIAGNOSIS — F17200 Nicotine dependence, unspecified, uncomplicated: Secondary | ICD-10-CM | POA: Insufficient documentation

## 2012-03-01 DIAGNOSIS — K029 Dental caries, unspecified: Secondary | ICD-10-CM | POA: Insufficient documentation

## 2012-03-01 DIAGNOSIS — F329 Major depressive disorder, single episode, unspecified: Secondary | ICD-10-CM | POA: Insufficient documentation

## 2012-03-01 DIAGNOSIS — D573 Sickle-cell trait: Secondary | ICD-10-CM | POA: Insufficient documentation

## 2012-03-01 DIAGNOSIS — F3289 Other specified depressive episodes: Secondary | ICD-10-CM | POA: Insufficient documentation

## 2012-03-01 DIAGNOSIS — K089 Disorder of teeth and supporting structures, unspecified: Secondary | ICD-10-CM | POA: Insufficient documentation

## 2012-03-01 DIAGNOSIS — K0889 Other specified disorders of teeth and supporting structures: Secondary | ICD-10-CM

## 2012-03-01 MED ORDER — HYDROCODONE-ACETAMINOPHEN 5-325 MG PO TABS: 1.0000 | ORAL_TABLET | ORAL | Status: DC | PRN

## 2012-03-01 MED ORDER — HYDROCODONE-ACETAMINOPHEN 5-325 MG PO TABS: 1.0000 | ORAL_TABLET | Freq: Once | ORAL | Status: DC

## 2012-03-01 NOTE — ED Notes (Signed)
Toothache for 3 days.  She has a child upstairs in peds.

## 2012-03-01 NOTE — ED Provider Notes (Signed)
History     CSN: [id number]  Arrival date & time 03/01/12  0130   First MD Initiated Contact with Patient 03/01/12 0144      Chief Complaint  Patient presents with  . Dental Pain   HPI  Provided by the patient. Patient is a 26 year old female who presents with complaints of persistent and worsening right upper dental pain. Patient reports having dental pains for the past 3-4 days. Patient has been taking ibuprofen regularly which is no longer helping with symptoms. Patient has been in the hospital with her young daughter who is diagnosed with whooping cough. Patient states that she had a prior appointment with her dentist later this month to have her right upper second premolar extracted. She admits to intermittent problems with the same tooth. She denies having any swelling of the gums, fever, chills or sweats. No difficulty breathing or swallowing..    Past Medical History  Diagnosis Date  . Sickle cell trait   . Depression   . GERD (gastroesophageal reflux disease)     History reviewed. No pertinent past surgical history.  Family History  Problem Relation Age of Onset  . Asthma Mother   . Hypertension Mother   . Asthma Brother   . Asthma Son     History  Substance Use Topics  . Smoking status: Current Some Day Smoker    Types: Cigarettes  . Smokeless tobacco: Never Used   Comment: Prior to pregnancy smokes 1-2 cigarettes a day  . Alcohol Use: No    OB History    Grav Para Term Preterm Abortions TAB SAB Ect Mult Living   3 3 3       3       Review of Systems  Constitutional: Negative for fever and chills.  HENT: Positive for dental problem. Negative for sore throat, drooling and trouble swallowing.   Gastrointestinal: Negative for nausea, vomiting and abdominal pain.  Skin: Negative for rash.    Allergies  Review of patient's allergies indicates no known allergies.  Home Medications   Current Outpatient Rx  Name Route Sig Dispense Refill  . IBUPROFEN  200 MG PO TABS Oral Take 200-400 mg by mouth every 6 (six) hours as needed. For pain      BP 120/85  Pulse 86  Temp 98.3 F (36.8 C) (Oral)  Resp 18  SpO2 100%  Breastfeeding? No  Physical Exam  Nursing note and vitals reviewed. Constitutional: She is oriented to person, place, and time. She appears well-developed and well-nourished. No distress.  HENT:  Head: Normocephalic.  Mouth/Throat:         Several dental fillings throughout lower molars. Patient has right upper first premolar and second molar tooth removed. Pt has pain over her second premolar tooth. There are dental caries present to this tooth.  There is no swelling of the adjacent or surrounding gum tissue.  Neck: Normal range of motion. Neck supple.  Cardiovascular: Normal rate and regular rhythm.   Pulmonary/Chest: Effort normal and breath sounds normal.  Abdominal: Soft.  Lymphadenopathy:    She has no cervical adenopathy.  Neurological: She is alert and oriented to person, place, and time.  Skin: Skin is warm and dry. No rash noted.  Psychiatric: She has a normal mood and affect. Her behavior is normal.    ED Course  Procedures      1. Pain, dental   2. Dental caries       MDM  Pt seen and evaluated. Patient appears  uncomfortable. Patient offered a local dental block but refused. Patient given one Norco in the emergency room. Patient also provided a small prescription for the same. She has been instructed to contact her dentist for continued evaluation and treatment.        Angus Seller, [place] 03/01/12 [phone]

## 2012-03-01 NOTE — ED Provider Notes (Signed)
Medical screening examination/treatment/procedure(s) were performed by non-physician practitioner and as supervising physician I was immediately available for consultation/collaboration.   Richardean Canal, MD 03/01/12 2129

## 2012-03-01 NOTE — Discharge Instructions (Signed)
You were seen and evaluated for your dental pains.  Please call your dentist tomorrow for continued evaluation and treatment of your symptoms.   Dental Caries  Tooth decay (dental caries, cavities) is the most common of all oral diseases. It occurs in all ages but is more common in children and young adults.  CAUSES  Bacteria in your mouth combine with foods (particularly sugars and starches) to produce plaque. Plaque is a substance that sticks to the hard surfaces of teeth. The bacteria in the plaque produce acids that attack the enamel of teeth. Repeated acid attacks dissolve the enamel and create holes in the teeth. Root surfaces of teeth may also get these holes.  Other contributing factors include:   Frequent snacking and drinking of cavity-producing foods and liquids.  Poor oral hygiene.  Dry mouth.  Substance abuse such as methamphetamine.  Broken or poor fitting dental restorations.  Eating disorders.  Gastroesophageal reflux disease (GERD).  Certain radiation treatments to the head and neck. SYMPTOMS  At first, dental decay appears as white, chalky areas on the enamel. In this early stage, symptoms are seldom present. As the decay progresses, pits and holes may appear on the enamel surfaces. Progression of the decay will lead to softening of the hard layers of the tooth. At this point you may experience some pain or achy feeling after sweet, hot, or cold foods or drinks are consumed. If left untreated, the decay will reach the internal structures of the tooth and produce severe pain. Extensive dental treatment, such as root canal therapy, may be needed to save the tooth at this late stage of decay development.  DIAGNOSIS  Most cavities will be detected during regular check-ups. A thorough medical and dental history will be taken by the dentist. The dentist will use instruments to check the surfaces of your teeth for any breakdown or discoloration. Some dentists have special  instruments, such as lasers, that detect tooth decay. Dental X-rays may also show some cavities that are not visible to the eye (such as between the contact areas of the teeth). TREATMENT  Treatment involves removal of the tooth decay and replacement with a restorative material such as silver, gold, or composite (white) material. However, if the decay involves a large area of the tooth and there is little remaining healthy tooth structure, a cap (crown) will be fitted over the remaining structure. If the decay involves the center part of the tooth (pulp), root canal treatment will be needed before any type of dental restoration is placed. If the tooth is severely destroyed by the decay process, leaving the remaining tooth structures unrestorable, the tooth will need to be pulled (extracted). Some early tooth decay may be reversed by fluoride treatments and thorough brushing and flossing at home. PREVENTION   Eat healthy foods. Restrict the amount of sugary, starchy foods and liquids you consume. Avoid frequent snacking and drinking of unhealthy foods and liquids.  Sealants can help with prevention of cavities. Sealants are composite resins applied onto the biting surfaces of teeth at risk for decay. They smooth out the pits and grooves and prevent food from being trapped in them. This is done in early childhood before tooth decay has started.  Fluoride tablets may also be prescribed to children between 6 months and 89 years of age if your drinking water is not fluoridated. The fluoride absorbed by the tooth enamel makes teeth less susceptible to decay. Thorough daily cleaning with a toothbrush and dental floss is the best  way to prevent cavities. Use of a fluoride toothpaste is highly recommended. Fluoride mouth rinses may be used in specific cases.  Topical application of fluoride by your dentist is important in children.  Regular visits with a dentist for checkups and cleanings are also  important. SEEK IMMEDIATE DENTAL CARE IF:  You have a fever.  You develop redness and swelling of your face, jaw, or neck.  You develop swelling around a tooth.  You are unable to open your mouth or cannot swallow.  You have severe pain uncontrolled by pain medicine. Document Released: 01/31/2002 Document Revised: 08/03/2011 Document Reviewed: 10/16/2010 [facility] Patient Information 2013 [place], [place].    Dental Pain A tooth ache may be caused by cavities (tooth decay). Cavities expose the nerve of the tooth to air and hot or cold temperatures. It may come from an infection or abscess (also called a boil or furuncle) around your tooth. It is also often caused by dental caries (tooth decay). This causes the pain you are having. DIAGNOSIS  Your caregiver can diagnose this problem by exam. TREATMENT   If caused by an infection, it may be treated with medications which kill germs (antibiotics) and pain medications as prescribed by your caregiver. Take medications as directed.  Only take over-the-counter or prescription medicines for pain, discomfort, or fever as directed by your caregiver.  Whether the tooth ache today is caused by infection or dental disease, you should see your dentist as soon as possible for further care. SEEK MEDICAL CARE IF: The exam and treatment you received today has been provided on an emergency basis only. This is not a substitute for complete medical or dental care. If your problem worsens or new problems (symptoms) appear, and you are unable to meet with your dentist, call or return to this location. SEEK IMMEDIATE MEDICAL CARE IF:   You have a fever.  You develop redness and swelling of your face, jaw, or neck.  You are unable to open your mouth.  You have severe pain uncontrolled by pain medicine. MAKE SURE YOU:   Understand these instructions.  Will watch your condition.  Will get help right away if you are not doing well or get  worse. Document Released: 05/11/2005 Document Revised: 08/03/2011 Document Reviewed: 12/28/2007 [facility] Patient Information 2013 [place], [place].     RESOURCE GUIDE  Chronic Pain Problems: Contact [facility]  [phone] Patients need to be referred by their primary care doctor.  Insufficient Money for Medicine: Contact United Way:  call "211" or [facility] [phone].  No Primary Care Doctor: - Call Health Connect  [phone] - can help you locate a primary care doctor that  accepts your insurance, provides certain services, etc. - Physician Referral [phone]  Agencies that provide inexpensive medical care: - [facility]  [phone] - [facility]  [phone] - [facility]  [phone] [facility]  [phone] - Planned Parenthood  [phone] [facility]  [phone]  Medicaid-accepting [facility] Providers: - [facility]- [address]  [phone], Mon-Fri 9am-7pm, Sat 9am-1pm - [facility]- [address] [place], [address] [place]  [phone] - [facility]- [address] [place]  [phone] [facility]- [address]  [phone] - Renaye Rakers- [address] [place], [address], [phone]  Only accepts [place] patients after they have their name  applied  to their card  Self Pay (no insurance) in [facility]: - Sickle Cell Patients: Dr Willey Blade, [facility]  [address] [place], [phone] - [facility]- [address] [place]  [phone]       Patrcia Dolly [facility] [place]- [address]       -     [facility]- see information above (Speak to [employer] if you do not have insurance)       -  [facility]- [address] [place], [phone]       -  [facility] [place]- [address],  [phone]       -  [facility]- [address] [phone]       -  Dr Julio Sicks-  [address], [address], [place], [phone]       -  [facility]- [address], [phone]       -  [facility]- [address], [phone], also [address] [phone]       -    [facility]- [address] [place], [phone], 1st & 3rd Saturday   every month, 10am-1pm  1) Find a Doctor and Pay Out of Pocket Although you won't have to find out who is covered by your insurance plan, it is a good idea to ask around and get recommendations. You will then need to call the office and see if the doctor you have chosen will accept you as a new patient and what types of options they offer for patients who are self-pay. Some doctors offer discounts or will set up payment plans for their patients who do not have insurance, but you will need to ask so you aren't surprised when you get to your appointment.  2) Contact Your Local Health Department Not all health departments have doctors that can see patients for sick visits, but many do, so it is worth a call to see if yours does. If you don't know where your local health department is, you can check in your phone book. The CDC also has a tool to help you locate your state's health department, and many state websites also have listings of all of their local health departments.  3) Find a Walk-in Clinic If your illness is not likely to be very severe or complicated, you may want to try a walk in clinic. These are popping up all over the country in pharmacies, drugstores, and shopping centers. They're usually staffed by nurse practitioners or physician assistants that have been trained to treat common illnesses and complaints. They're usually fairly quick and inexpensive. However, if you have serious medical issues or chronic medical problems, these are probably not your best option  STD Testing - [facility] Department of [facility]  [place], STD Clinic, [address], [place], phone [phone] or [phone].  Monday - Friday, call for an appointment. [facility] Department of [employer], STD Clinic, [place] [address], [place], phone [phone] or [phone].  Monday - Friday, call for an appointment.  Abuse/Neglect: [facility] Child Abuse Hotline 817-621-6989 [facility] Child Abuse Hotline 867-406-7802 (After Hours)  Emergency Shelter:  [facility] (807) [phone]  Maternity Homes: - Room at the [employer] [facility] 920-851-2848 - [facility] [phone]  MRSA Hotline #:  409-8119  [facility] Resources  [facility] [place]  United Way [facility] Dept. [address]                 [address]         [address] [place] [address]  Blondell Reveal Phone:  [phone]                                  Phone:  [phone]                   Phone:  [phone]  [facility] Mental Health, [phone] - [facility] Adult [facility] Services - [phone]       -     [facility] in [place], [address]                                  [phone], [facility] Child Abuse Hotline 705 038 7742 or 8164216207 (After Hours)   Behavioral Health Services  Substance Abuse Resources: - Alcohol and Drug Services  ([phone] - [facility] [phone] - [facility] [place] [phone] Floydene Flock [phone] - Residential & Outpatient Substance Abuse Program  [phone]  Psychological Services: [facility]  [phone] Services  [phone] - [facility], [phone] [place]. [address] [place], ACCESS LINE: 860-558-2022 or [phone], [employer]za  Dental  Assistance  If unable to pay or uninsured, contact:  [facility] or [facility] to become qualified for the adult dental clinic.  Patients with Medicaid: [facility] [phone] [address], [phone] [address] [phone]  If unable to pay, or uninsured, contact [facility] [phone]) or [facility] Department [phone] in [place], [phone] in [facility]) to become qualified for the adult dental clinic  Other Low-Cost Community Dental Services: - [facility]- [address] [place], [place], [place], [address], [phone], [phone], 2nd and 4th Thursday of the month at 6:30am.  10 clients each day by appointment, can sometimes see walk-in patients if someone does not show for an appointment. [facility]- [address] Ether Griffins [place], [place], [address], [phone] - [facility]- [address], [place], [place], [address], [phone] - [facility]- [phone] [facility]- [phone] [facility]- [phone]

## 2012-03-22 ENCOUNTER — Ambulatory Visit: Payer: Medicaid Other

## 2012-06-14 ENCOUNTER — Ambulatory Visit (HOSPITAL_COMMUNITY)
Admission: RE | Admit: 2012-06-14 | Discharge: 2012-06-14 | Disposition: A | Discharge status: Home / Self Care | Payer: Medicare Other | Source: Ambulatory Visit | Attending: Nurse Practitioner | Admitting: Nurse Practitioner

## 2012-06-14 ENCOUNTER — Other Ambulatory Visit (HOSPITAL_COMMUNITY): Payer: Self-pay | Admitting: Nurse Practitioner

## 2012-06-14 DIAGNOSIS — R1031 Right lower quadrant pain: Secondary | ICD-10-CM | POA: Insufficient documentation

## 2012-06-14 DIAGNOSIS — Z30431 Encounter for routine checking of intrauterine contraceptive device: Secondary | ICD-10-CM | POA: Insufficient documentation

## 2012-06-14 DIAGNOSIS — R102 Pelvic and perineal pain: Secondary | ICD-10-CM

## 2012-06-14 DIAGNOSIS — N949 Unspecified condition associated with female genital organs and menstrual cycle: Secondary | ICD-10-CM | POA: Insufficient documentation

## 2012-06-18 ENCOUNTER — Emergency Department (HOSPITAL_COMMUNITY): Payer: Medicare Other

## 2012-06-18 ENCOUNTER — Emergency Department (HOSPITAL_COMMUNITY)
Admission: EM | Admit: 2012-06-18 | Discharge: 2012-06-18 | Disposition: A | Discharge status: Home / Self Care | Payer: Medicare Other | Attending: Emergency Medicine | Admitting: Emergency Medicine

## 2012-06-18 ENCOUNTER — Encounter (HOSPITAL_COMMUNITY): Payer: Self-pay | Admitting: *Deleted

## 2012-06-18 DIAGNOSIS — Y9389 Activity, other specified: Secondary | ICD-10-CM | POA: Insufficient documentation

## 2012-06-18 DIAGNOSIS — Y929 Unspecified place or not applicable: Secondary | ICD-10-CM | POA: Insufficient documentation

## 2012-06-18 DIAGNOSIS — F17200 Nicotine dependence, unspecified, uncomplicated: Secondary | ICD-10-CM | POA: Insufficient documentation

## 2012-06-18 DIAGNOSIS — S60945A Unspecified superficial injury of left ring finger, initial encounter: Secondary | ICD-10-CM

## 2012-06-18 DIAGNOSIS — IMO0001 Reserved for inherently not codable concepts without codable children: Secondary | ICD-10-CM

## 2012-06-18 DIAGNOSIS — IMO0002 Reserved for concepts with insufficient information to code with codable children: Secondary | ICD-10-CM | POA: Insufficient documentation

## 2012-06-18 DIAGNOSIS — W2209XA Striking against other stationary object, initial encounter: Secondary | ICD-10-CM | POA: Insufficient documentation

## 2012-06-18 DIAGNOSIS — Z862 Personal history of diseases of the blood and blood-forming organs and certain disorders involving the immune mechanism: Secondary | ICD-10-CM | POA: Insufficient documentation

## 2012-06-18 DIAGNOSIS — Z8659 Personal history of other mental and behavioral disorders: Secondary | ICD-10-CM | POA: Insufficient documentation

## 2012-06-18 DIAGNOSIS — Z8719 Personal history of other diseases of the digestive system: Secondary | ICD-10-CM | POA: Insufficient documentation

## 2012-06-18 MED ORDER — LIDOCAINE HCL 2 % IJ SOLN
10.0000 mL | Freq: Once | INTRAMUSCULAR | Status: AC
  Administered 2012-06-18: 200 mg via INTRADERMAL

## 2012-06-18 MED ORDER — CEPHALEXIN 250 MG PO CAPS
500.0000 mg | ORAL_CAPSULE | Freq: Once | ORAL | Status: AC
  Administered 2012-06-18: 500 mg via ORAL
  Filled 2012-06-18: qty 2

## 2012-06-18 MED ORDER — CLINDAMYCIN HCL 150 MG PO CAPS: 150.0000 mg | ORAL_CAPSULE | Freq: Four times a day (QID) | ORAL | Status: DC

## 2012-06-18 MED ORDER — HYDROCODONE-ACETAMINOPHEN 5-325 MG PO TABS: 1.0000 | ORAL_TABLET | ORAL | Status: DC | PRN

## 2012-06-18 NOTE — ED Provider Notes (Signed)
History     CSN: [id number]  Arrival date & time 06/18/12  0102   First MD Initiated Contact with Patient 06/18/12 0600      No chief complaint on file.   (Consider location/radiation/quality/duration/timing/severity/associated sxs/prior treatment) HPI  27 year old female presents for evaluations of finger injury. Patient reports accidentally slammed her left ring finger against the door 3 days ago. She endorses acute onset of sharp throbbing pain has been persistent, 9/10, worsening with palpation or with movement. She is noticing that her finger is swollen and looked infected. She attempt to remove her acrylic nail without success. Pain is nonradiating, persistent, and not improved with over-the-counter pain medication. She denies fever, chills, or rash. She is up-to-date with her tetanus shot. No history of diabetes.  Past Medical History  Diagnosis Date  . Sickle cell trait   . Depression   . GERD (gastroesophageal reflux disease)     History reviewed. No pertinent past surgical history.  Family History  Problem Relation Age of Onset  . Asthma Mother   . Hypertension Mother   . Asthma Brother   . Asthma Son     History  Substance Use Topics  . Smoking status: Current Some Day Smoker    Types: Cigarettes  . Smokeless tobacco: Never Used     Comment: Prior to pregnancy smokes 1-2 cigarettes a day  . Alcohol Use: No    OB History    Grav Para Term Preterm Abortions TAB SAB Ect Mult Living   3 3 3       3       Review of Systems  Constitutional: Negative for fever.  Musculoskeletal: Positive for myalgias and arthralgias.  Skin: Positive for wound.  Neurological: Negative for numbness.    Allergies  Review of patient's allergies indicates no known allergies.  Home Medications  No current outpatient prescriptions on file.  BP 121/65  Pulse 89  Temp 98.3 F (36.8 C) (Oral)  Resp 12  LMP 05/27/2012  Physical Exam  Nursing note and vitals  reviewed. Constitutional: She appears well-developed and well-nourished. No distress.  HENT:  Head: Atraumatic.  Eyes: Conjunctivae normal are normal.  Neck: Neck supple.  Musculoskeletal: She exhibits tenderness (L ring finger: distal tip of finger is edematous, erythematous, with warmth and tenderness to palpation.  paronychia noted to lateral aspect of nail fold. overlying acrylic nail ).  Neurological: She is alert.  Skin: No rash noted.    ED Course  Procedures (including critical care time)  Labs Reviewed - No data to display No results found.   No diagnosis found.    Dg Hand Complete Left  06/18/2012  *RADIOLOGY REPORT*  Clinical Data: Slammed left hand in car door, with worsening swelling and pain.  LEFT HAND - COMPLETE 3+ VIEW  Comparison: None.  Findings: There is no evidence of fracture or dislocation.  The joint spaces are preserved; the soft tissues are unremarkable in appearance.  The carpal rows are intact, and demonstrate normal alignment.  IMPRESSION: No evidence of fracture or dislocation.   Original Report Authenticated By: Tonia Ghent, M.D.       INCISION AND DRAINAGE Performed by: Fayrene Helper Consent: Verbal consent obtained. Risks and benefits: risks, benefits and alternatives were discussed Type: abscess  Body area: L ring finger, distal  Anesthesia: digital block  Incision was made with a scalpel.  Local anesthetic: lidocaine 2% w/o epinephrine  Anesthetic total: 8 ml  Complexity: complex Blunt dissection to break up loculations  Drainage: purulent  Drainage amount: moderate  Packing material: none  Patient tolerance: Patient tolerated the procedure well with no immediate complications.  1. Paronychia, L ring finger     MDM    Patient presenting with swelling of the left ring finger. Presentation consistent with paronychia. I/D was indicated. See procedure note. Patient tolerated procedure well. Antibiotic topical ointment and a  bandage were applied to the incision site. No complications were noted except patient has acrylic nails that appears to be clipped and jagged. Patient prescribed clindamycin. Patient is to follow up with primary care provider. Symptomatic treatment discussed. Return to ER if severe pain, worsening infection, or any other concerns.   Impression:  Paronychia   Plan:  . Discharge from ED  . Instructed Pt on proper care and hygiene of the laceration site, including warm compress or soaks to affected digit for tid, application of bacitracin ointment, bandaging, and cold application. Advised Pt to elevate affected site above heart line should minor oozing present.   . Clindamycin 150mg  BID x10d, and instructed Pt to completed entire Ab course.  . Advised Pt on preventive measures, including refraining from overzealous manicuring, nail mastication, thumb-sucking, and soaking hands in spoiled H2O.  . Instructed Pt on S/Sx of wound infection, including pain, fever, redness, swelling, tracking, and sensory/motor deficits, and instructed to return to ER immediately should these Sx present. Pt verbally expressed understanding and all questions were addressed to Pt's satisfaction.     Fayrene Helper, PA-C 06/18/12 0721  Fayrene Helper, PA-C 06/18/12 1610  Fayrene Helper, PA-C 06/22/12 1006

## 2012-06-18 NOTE — ED Notes (Signed)
Back from xray

## 2012-06-18 NOTE — ED Notes (Signed)
Tran, PA at bedside.  

## 2012-06-18 NOTE — ED Notes (Signed)
Ring finger on lt. Hand side got slamped it in the car door. Swollen and looks infected. X 2 days ago.

## 2012-06-18 NOTE — ED Notes (Signed)
Patient transported to X-ray 

## 2012-06-22 NOTE — ED Provider Notes (Signed)
Medical screening examination/treatment/procedure(s) were performed by non-physician practitioner and as supervising physician I was immediately available for consultation/collaboration.   Carleene Cooper III, MD 06/22/12 [phone]

## 2014-03-26 ENCOUNTER — Encounter (HOSPITAL_COMMUNITY): Payer: Self-pay | Admitting: *Deleted

## 2017-06-13 ENCOUNTER — Encounter (HOSPITAL_COMMUNITY): Payer: Self-pay | Admitting: Emergency Medicine

## 2017-06-13 ENCOUNTER — Other Ambulatory Visit: Payer: Self-pay

## 2017-06-13 ENCOUNTER — Emergency Department (HOSPITAL_COMMUNITY)
Admission: EM | Admit: 2017-06-13 | Discharge: 2017-06-13 | Disposition: A | Discharge status: Home / Self Care | Payer: No Typology Code available for payment source | Attending: Emergency Medicine | Admitting: Emergency Medicine

## 2017-06-13 ENCOUNTER — Emergency Department (HOSPITAL_COMMUNITY): Payer: No Typology Code available for payment source

## 2017-06-13 DIAGNOSIS — F17210 Nicotine dependence, cigarettes, uncomplicated: Secondary | ICD-10-CM | POA: Diagnosis not present

## 2017-06-13 DIAGNOSIS — M79605 Pain in left leg: Secondary | ICD-10-CM | POA: Insufficient documentation

## 2017-06-13 DIAGNOSIS — Y9389 Activity, other specified: Secondary | ICD-10-CM | POA: Insufficient documentation

## 2017-06-13 DIAGNOSIS — F1210 Cannabis abuse, uncomplicated: Secondary | ICD-10-CM | POA: Diagnosis not present

## 2017-06-13 DIAGNOSIS — Y998 Other external cause status: Secondary | ICD-10-CM | POA: Diagnosis not present

## 2017-06-13 DIAGNOSIS — M79604 Pain in right leg: Secondary | ICD-10-CM

## 2017-06-13 DIAGNOSIS — Y92481 Parking lot as the place of occurrence of the external cause: Secondary | ICD-10-CM | POA: Diagnosis not present

## 2017-06-13 MED ORDER — NAPROXEN 500 MG PO TABS: 500.0000 mg | ORAL_TABLET | Freq: Two times a day (BID) | ORAL | 0 refills | Status: DC

## 2017-06-13 MED ORDER — METHOCARBAMOL 500 MG PO TABS: 500.0000 mg | ORAL_TABLET | Freq: Two times a day (BID) | ORAL | 0 refills | Status: DC

## 2017-06-13 MED ORDER — LIDOCAINE 5 % EX PTCH: 1.0000 | MEDICATED_PATCH | CUTANEOUS | 0 refills | Status: DC
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
## 2017-06-13 NOTE — Discharge Instructions (Signed)
There were no acute abnormalities on the x-rays. Take it easy, but do not lay around too much as this may make any stiffness worse.  °Antiinflammatory medications: Take 600 mg of ibuprofen every 6 hours or 440 mg (over the counter dose) to 500 mg (prescription dose) of naproxen every 12 hours for the next 3 days. After this time, these medications may be used as needed for pain. Take these medications with food to avoid upset stomach. Choose only one of these medications, do not take them together.  °Tylenol: Should you continue to have additional pain while taking the ibuprofen or naproxen, you may add in tylenol as needed. Your daily total maximum amount of tylenol from all sources should be limited to 4000mg/day for persons without liver problems, or 2000mg/day for those with liver problems. °Muscle relaxer: Robaxin is a muscle relaxer and may help loosen stiff muscles. Do not take the Robaxin while driving or performing other dangerous activities.  °Lidocaine patches: These are available via either prescription or over-the-counter. The over-the-counter option may be more economical one and are likely just as effective. There are multiple over-the-counter brands, such as Salonpas. °Exercises: Be sure to perform the attached exercises starting with three times a week and working up to performing them daily. This is an essential part of preventing long term problems.  ° °Follow up with a primary care provider for any future management of these complaints. °

## 2018-01-04 ENCOUNTER — Emergency Department (HOSPITAL_COMMUNITY)
Admission: EM | Admit: 2018-01-04 | Discharge: 2018-01-04 | Disposition: A | Discharge status: Home / Self Care | Payer: Medicare Other | Attending: Emergency Medicine | Admitting: Emergency Medicine

## 2018-01-04 ENCOUNTER — Other Ambulatory Visit: Payer: Self-pay

## 2018-01-04 ENCOUNTER — Encounter (HOSPITAL_COMMUNITY): Payer: Self-pay | Admitting: *Deleted

## 2018-01-04 DIAGNOSIS — N898 Other specified noninflammatory disorders of vagina: Secondary | ICD-10-CM | POA: Insufficient documentation

## 2018-01-04 DIAGNOSIS — Z5321 Procedure and treatment not carried out due to patient leaving prior to being seen by health care provider: Secondary | ICD-10-CM | POA: Insufficient documentation

## 2018-01-04 NOTE — ED Notes (Signed)
Patient came up to nurse first desk, stating "can you take me out of the system, I've been here since 6311 and I've got to go get my kids." RN informed patient that she had arrived in our system 1 hour 36 minutes prior to my interaction and patient stated, after encouraging her to continue to wait and patient stated that she would just [facility] [place] and turned in her BP cuff and patient stickers.

## 2018-01-04 NOTE — ED Triage Notes (Signed)
Pt in c/o vaginal discharge and itching for the last few days, no distress noted

## 2018-01-05 NOTE — ED Notes (Signed)
Follow up call made  No answer  01/05/18 0815  s Jaritza Duignan rn

## 2018-06-30 ENCOUNTER — Encounter (HOSPITAL_COMMUNITY): Payer: Self-pay

## 2018-06-30 ENCOUNTER — Emergency Department (HOSPITAL_COMMUNITY)
Admission: EM | Admit: 2018-06-30 | Discharge: 2018-07-01 | Disposition: A | Discharge status: Home / Self Care | Payer: Medicare Other | Attending: Emergency Medicine | Admitting: Emergency Medicine

## 2018-06-30 ENCOUNTER — Emergency Department (HOSPITAL_COMMUNITY): Payer: Medicare Other

## 2018-06-30 ENCOUNTER — Other Ambulatory Visit: Payer: Self-pay

## 2018-06-30 DIAGNOSIS — D573 Sickle-cell trait: Secondary | ICD-10-CM | POA: Diagnosis not present

## 2018-06-30 DIAGNOSIS — R1030 Lower abdominal pain, unspecified: Secondary | ICD-10-CM | POA: Diagnosis present

## 2018-06-30 DIAGNOSIS — F17210 Nicotine dependence, cigarettes, uncomplicated: Secondary | ICD-10-CM | POA: Diagnosis not present

## 2018-06-30 DIAGNOSIS — F329 Major depressive disorder, single episode, unspecified: Secondary | ICD-10-CM | POA: Diagnosis not present

## 2018-06-30 DIAGNOSIS — R1084 Generalized abdominal pain: Secondary | ICD-10-CM | POA: Insufficient documentation

## 2018-06-30 DIAGNOSIS — R1031 Right lower quadrant pain: Secondary | ICD-10-CM | POA: Diagnosis not present

## 2018-06-30 DIAGNOSIS — N898 Other specified noninflammatory disorders of vagina: Secondary | ICD-10-CM | POA: Diagnosis not present

## 2018-06-30 LAB — COMPREHENSIVE METABOLIC PANEL
ALBUMIN: 4.1 g/dL (ref 3.5–5.0)
ALT: 14 U/L (ref 0–44)
AST: 18 U/L (ref 15–41)
Alkaline Phosphatase: 75 U/L (ref 38–126)
Anion gap: 6 (ref 5–15)
BILIRUBIN TOTAL: 0.4 mg/dL (ref 0.3–1.2)
BUN: 17 mg/dL (ref 6–20)
CO2: 28 mmol/L (ref 22–32)
Calcium: 9.2 mg/dL (ref 8.9–10.3)
Chloride: 105 mmol/L (ref 98–111)
Creatinine, Ser: 1.12 mg/dL — ABNORMAL HIGH (ref 0.44–1.00)
GFR calc Af Amer: >60 mL/min (ref >60)
GFR calc non Af Amer: >60 mL/min (ref >60)
GLUCOSE: 85 mg/dL (ref 70–99)
POTASSIUM: 3.6 mmol/L (ref 3.5–5.1)
SODIUM: 139 mmol/L (ref 135–145)
TOTAL PROTEIN: 7.4 g/dL (ref 6.5–8.1)

## 2018-06-30 LAB — URINALYSIS, ROUTINE W REFLEX MICROSCOPIC
Bilirubin Urine: NEGATIVE
Glucose, UA: NEGATIVE mg/dL
Hgb urine dipstick: NEGATIVE
KETONES UR: NEGATIVE mg/dL
Leukocytes, UA: NEGATIVE
Nitrite: NEGATIVE
PROTEIN: NEGATIVE mg/dL
Specific Gravity, Urine: 1.012 (ref 1.005–1.030)
pH: 7 (ref 5.0–8.0)

## 2018-06-30 LAB — I-STAT BETA HCG BLOOD, ED (MC, WL, AP ONLY): I-stat hCG, quantitative: <5 m[IU]/mL (ref <5)

## 2018-06-30 LAB — CBC
HCT: 38.8 % (ref 36.0–46.0)
HEMOGLOBIN: 12.9 g/dL (ref 12.0–15.0)
MCH: 27.7 pg (ref 26.0–34.0)
MCHC: 33.2 g/dL (ref 30.0–36.0)
MCV: 83.4 fL (ref 80.0–100.0)
Platelets: 299 10*3/uL (ref 150–400)
RBC: 4.65 MIL/uL (ref 3.87–5.11)
RDW: 13.2 % (ref 11.5–15.5)
WBC: 8.5 10*3/uL (ref 4.0–10.5)
nRBC: 0 % (ref 0.0–0.2)

## 2018-06-30 LAB — WET PREP, GENITAL
Clue Cells Wet Prep HPF POC: NONE SEEN
Sperm: NONE SEEN
Trich, Wet Prep: NONE SEEN
WBC, Wet Prep HPF POC: NONE SEEN
Yeast Wet Prep HPF POC: NONE SEEN

## 2018-06-30 LAB — LIPASE, BLOOD: Lipase: 38 U/L (ref 11–51)

## 2018-06-30 MED ORDER — IOPAMIDOL (ISOVUE-300) INJECTION 61%
100.0000 mL | Freq: Once | INTRAVENOUS | Status: AC | PRN
  Administered 2018-07-01: 100 mL via INTRAVENOUS

## 2018-06-30 MED ORDER — SODIUM CHLORIDE 0.9% FLUSH: 3.0000 mL | Freq: Once | INTRAVENOUS | Status: DC

## 2018-06-30 NOTE — ED Notes (Signed)
Pelvic exam set up at bedside.  

## 2018-06-30 NOTE — ED Notes (Signed)
Spoke with lab. Lab stated they will draw gray urine culture from the UA sent at 2231. Tech sent rec. to lab at this time.

## 2018-06-30 NOTE — ED Provider Notes (Signed)
[facility]-EMERGENCY DEPT Provider Note   CSN: [id number] Arrival date & time: 06/30/18  2016     History   Chief Complaint Chief Complaint  Patient presents with  . Abdominal Pain    HPI Samantha Bradford is a 33 y.o. female with a past medical history of depression, who presents today for evaluation of lower abdominal pain.  She reports that her pain started on Monday, was initially in the middle of her stomach however is now worse on the right side.  She denies nausea vomiting or diarrhea.  She states that she has not had any fevers.  Does not get menstrual cycles as she took Depakote for many years.  She does report that she recently had unprotected sex with 1 partner.  She denies any abnormal vaginal discharge.  She does report that it feels "tingly" when she pees however she denies any changes to lotions, detergents, soaps or other exposures.  She states that she questionably has had a cyst on her ovary in the past however never followed up with ultrasounds or evaluation.  She has never had any abdominal surgeries.  HPI  Past Medical History:  Diagnosis Date  . Depression   . GERD (gastroesophageal reflux disease)   . Sickle cell trait [facility](HCC)     Patient Active Problem List   Diagnosis Date Noted  . Normal vaginal delivery 01/04/2012    History reviewed. No pertinent surgical history.   OB History    Gravida  3   Para  3   Term  3   Preterm      AB      Living  3     SAB      TAB      Ectopic      Multiple      Live Births  1            Home Medications    Prior to Admission medications   Not on File    Family History Family History  Problem Relation Age of Onset  . Asthma Mother   . Hypertension Mother   . Asthma Brother   . Asthma Son     Social History Social History   Tobacco Use  . Smoking status: Current Some Day Smoker    Types: Cigarettes  . Smokeless tobacco: Never Used  . Tobacco comment: Prior  to pregnancy smokes 1-2 cigarettes a day  Substance Use Topics  . Alcohol use: No  . Drug use: Yes    Types: Marijuana    Comment: last used 6 weeks ago     Allergies   Patient has no known allergies.   Review of Systems Review of Systems  Constitutional: Negative for chills and fever.  HENT: Negative for congestion.   Respiratory: Negative for chest tightness and shortness of breath.   Gastrointestinal: Positive for abdominal pain. Negative for diarrhea, nausea and vomiting.  Genitourinary: Positive for dysuria and frequency. Negative for difficulty urinating, menstrual problem and pelvic pain.  Musculoskeletal: Negative for back pain and neck pain.  Skin: Negative for color change and rash.  Neurological: Negative for weakness.  Psychiatric/Behavioral: Negative for confusion.  All other systems reviewed and are negative.    Physical Exam Updated Vital Signs BP 118/71 (BP Location: Right Arm)   Pulse 75   Temp 98 F (36.7 C) (Oral)   Resp 17   SpO2 100%   Physical Exam Vitals signs and nursing note reviewed. Exam conducted with  a chaperone present (ED tech).  Constitutional:      General: She is not in acute distress.    Appearance: She is well-developed.  HENT:     Head: Normocephalic and atraumatic.     Mouth/Throat:     Mouth: Mucous membranes are moist.  Eyes:     Extraocular Movements: Extraocular movements intact.     Conjunctiva/sclera: Conjunctivae normal.  Neck:     Musculoskeletal: Neck supple.  Cardiovascular:     Rate and Rhythm: Normal rate and regular rhythm.     Heart sounds: Normal heart sounds. No murmur.  Pulmonary:     Effort: Pulmonary effort is normal. No respiratory distress.     Breath sounds: Normal breath sounds.  Abdominal:     General: Bowel sounds are normal. There is no distension.     Palpations: Abdomen is soft.     Tenderness: There is abdominal tenderness in the right lower quadrant and suprapubic area. There is no right  CVA tenderness, left CVA tenderness, guarding or rebound. Positive signs include McBurney's sign. Negative signs include Murphy's sign.     Hernia: No hernia is present.  Genitourinary:    Vagina: Vaginal discharge (White discharge) and tenderness present.     Cervix: Cervical motion tenderness present. No discharge or friability.     Uterus: Tender.      Adnexa:        Right: Tenderness present. No mass.         Left: No mass or tenderness.    Skin:    General: Skin is warm and dry.  Neurological:     General: No focal deficit present.     Mental Status: She is alert.     Cranial Nerves: No cranial nerve deficit.     Motor: No weakness.  Psychiatric:        Mood and Affect: Mood normal.        Behavior: Behavior normal.      ED Treatments / Results  Labs (all labs ordered are listed, but only abnormal results are displayed) Labs Reviewed  COMPREHENSIVE METABOLIC PANEL - Abnormal; Notable for the following components:      Result Value   Creatinine, Ser 1.12 (*)    All other components within normal limits  URINALYSIS, ROUTINE W REFLEX MICROSCOPIC - Abnormal; Notable for the following components:   APPearance HAZY (*)    Bacteria, UA RARE (*)    All other components within normal limits  WET PREP, GENITAL  URINE CULTURE  LIPASE, BLOOD  CBC  RPR  HIV ANTIBODY (ROUTINE TESTING W REFLEX)  I-STAT BETA HCG BLOOD, ED ([facility], [facility], [facility] ONLY)  GC/CHLAMYDIA PROBE AMP ([place]) NOT AT [facility]    EKG None  Radiology No results found.  Procedures Procedures (including critical care time)  Medications Ordered in ED Medications  sodium chloride flush (NS) 0.9 % injection 3 mL (has no administration in time range)  iopamidol (ISOVUE-300) 61 % injection (has no administration in time range)  sodium chloride (PF) 0.9 % injection (has no administration in time range)  sodium chloride 0.9 % bolus 1,000 mL (has no administration in time range)  iopamidol (ISOVUE-300) 61 % injection  100 mL (100 mLs Intravenous Contrast Given 07/01/18 0012)     Initial Impression / Assessment and Plan / ED Course  I have reviewed the triage vital signs and the nursing notes.  Pertinent labs & imaging results that were available during my care of the patient were  reviewed by me and considered in my medical decision making (see chart for details).    Patient presents today for evaluation of right lower quadrant abdominal pain for 5 days.  She reports that this initially was more generalized abdominal pain however is now worse in the right lower quadrant.  She has a questionable history of ovarian cysts however states that she did not get follow-up ultrasounds.  Pelvic exam was performed with generalized tenderness, worse in the right lower quadrant.  She does not have any significant electrolyte or hematologic derangements.  Lipase is not elevated.  Urine shows rare bacteria with amorphous crystals with 0-5 squamous cells.  Do not suspect that this represents a true UTI.  She has never had any abdominal surgeries, and given location of pain in the right lower quadrant concern for appendicitis.  Patient is awaiting CT scan.  At shift change care was transferred to [facility] who will follow pending studies, re-evaulate and determine disposition.     Final Clinical Impressions(s) / ED Diagnoses   Final diagnoses:  Right lower quadrant abdominal pain    ED Discharge Orders    None       Norman Clay 07/01/18 0039    Benjiman Core, MD 07/01/18 [phone]

## 2018-06-30 NOTE — ED Triage Notes (Signed)
Pt reports lower abdominal pain that started Monday. She reports that she has had some clear discharge, but she states that is normal for her. Pt denies N/V/D. Endorses some tingling when she urinates.

## 2018-07-01 DIAGNOSIS — R1031 Right lower quadrant pain: Secondary | ICD-10-CM | POA: Diagnosis not present

## 2018-07-01 LAB — HIV ANTIBODY (ROUTINE TESTING W REFLEX): HIV Screen 4th Generation wRfx: NONREACTIVE

## 2018-07-01 LAB — SYPHILIS: RPR W/REFLEX TO RPR TITER AND TREPONEMAL ANTIBODIES, TRADITIONAL SCREENING AND DIAGNOSIS ALGORITHM: RPR Ser Ql: NONREACTIVE

## 2018-07-01 MED ORDER — SODIUM CHLORIDE 0.9 % IV BOLUS
1000.0000 mL | Freq: Once | INTRAVENOUS | Status: AC
  Administered 2018-07-01: 1000 mL via INTRAVENOUS

## 2018-07-01 MED ORDER — SODIUM CHLORIDE (PF) 0.9 % IJ SOLN
INTRAMUSCULAR | Status: AC
  Filled 2018-07-01: qty 50

## 2018-07-01 MED ORDER — IOPAMIDOL (ISOVUE-300) INJECTION 61%
INTRAVENOUS | Status: AC
  Filled 2018-07-01: qty 100

## 2018-07-01 NOTE — Discharge Instructions (Addendum)
You can be discharged home with normal lab tests and CT scan of your abdomen and pelvis. Follow up with a primary care provider if you have ongoing symptoms of abdominal pain. Tylenol and/or ibuprofen as needed. REturn to the emergency department with any high fever, severe pain or new concern.

## 2018-07-01 NOTE — ED Notes (Signed)
Pt transported to CT ?

## 2018-07-01 NOTE — ED Provider Notes (Signed)
5 days of generalized abdominal pain, now RLQ Pelvic has right side tenderness No menses in 6 months - Appy vs ovarian Labs essentially normal  Pending CT abd/pel  CT resulted as negative for abdominal or pelvic pathology. Recheck of the patient finds her comfortable without any complaints presently. Updated her as to the results of her tests. She can be discharged home. Will provide resources for PCP for her to become established for outpatient care.    Elpidio Anis, PA-C 07/01/18 0128    Benjiman Core, MD 07/01/18 [phone]

## 2018-07-02 LAB — GC/CHLAMYDIA PROBE AMP (~~LOC~~) NOT AT ARMC
Chlamydia: NEGATIVE
NEISSERIA GONORRHEA: NEGATIVE

## 2018-07-03 LAB — URINE CULTURE: Culture: >=100000 — AB

## 2018-07-25 ENCOUNTER — Encounter (HOSPITAL_COMMUNITY): Payer: Self-pay

## 2018-07-25 ENCOUNTER — Emergency Department (HOSPITAL_COMMUNITY)
Admission: EM | Admit: 2018-07-25 | Discharge: 2018-07-25 | Disposition: A | Discharge status: Home / Self Care | Payer: Medicare Other | Attending: Emergency Medicine | Admitting: Emergency Medicine

## 2018-07-25 ENCOUNTER — Other Ambulatory Visit: Payer: Self-pay

## 2018-07-25 DIAGNOSIS — M545 Low back pain, unspecified: Secondary | ICD-10-CM

## 2018-07-25 DIAGNOSIS — N390 Urinary tract infection, site not specified: Secondary | ICD-10-CM | POA: Insufficient documentation

## 2018-07-25 DIAGNOSIS — F17210 Nicotine dependence, cigarettes, uncomplicated: Secondary | ICD-10-CM | POA: Insufficient documentation

## 2018-07-25 DIAGNOSIS — M5489 Other dorsalgia: Secondary | ICD-10-CM | POA: Diagnosis present

## 2018-07-25 LAB — URINALYSIS, ROUTINE W REFLEX MICROSCOPIC
Bilirubin Urine: NEGATIVE
GLUCOSE, UA: NEGATIVE mg/dL
Ketones, ur: NEGATIVE mg/dL
Nitrite: POSITIVE — AB
PH: 5 (ref 5.0–8.0)
Protein, ur: NEGATIVE mg/dL
Specific Gravity, Urine: 1.018 (ref 1.005–1.030)
WBC, UA: >50 WBC/hpf — ABNORMAL HIGH (ref 0–5)

## 2018-07-25 LAB — POC URINE PREG, ED: Preg Test, Ur: NEGATIVE

## 2018-07-25 MED ORDER — MELOXICAM 7.5 MG PO TABS: 7.5000 mg | ORAL_TABLET | Freq: Every day | ORAL | 0 refills | Status: AC

## 2018-07-25 MED ORDER — KETOROLAC TROMETHAMINE 15 MG/ML IJ SOLN: 15.0000 mg | Freq: Once | INTRAMUSCULAR | Status: DC

## 2018-07-25 MED ORDER — KETOROLAC TROMETHAMINE 15 MG/ML IJ SOLN
15.0000 mg | Freq: Once | INTRAMUSCULAR | Status: AC
  Administered 2018-07-25: 15 mg via INTRAMUSCULAR
  Filled 2018-07-25: qty 1

## 2018-07-25 MED ORDER — NITROFURANTOIN MONOHYD MACRO 100 MG PO CAPS: 100.0000 mg | ORAL_CAPSULE | Freq: Two times a day (BID) | ORAL | 0 refills | Status: DC
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
## 2018-07-25 NOTE — ED Notes (Signed)
Patient verbalized understanding of discharge instructions and denies any further needs or questions at this time. VS stable. Patient ambulatory with steady gait.  

## 2018-07-25 NOTE — Discharge Instructions (Signed)
Thank you for allowing me to care for you today. Please return to the emergency department if you have new or worsening symptoms. Take your medications as instructed.  ° °

## 2018-08-04 ENCOUNTER — Emergency Department (HOSPITAL_COMMUNITY)
Admission: EM | Admit: 2018-08-04 | Discharge: 2018-08-04 | Disposition: A | Discharge status: Home / Self Care | Payer: Medicare Other | Attending: Emergency Medicine | Admitting: Emergency Medicine

## 2018-08-04 ENCOUNTER — Other Ambulatory Visit: Payer: Self-pay

## 2018-08-04 DIAGNOSIS — F17210 Nicotine dependence, cigarettes, uncomplicated: Secondary | ICD-10-CM | POA: Diagnosis not present

## 2018-08-04 DIAGNOSIS — L0291 Cutaneous abscess, unspecified: Secondary | ICD-10-CM

## 2018-08-04 DIAGNOSIS — Z79899 Other long term (current) drug therapy: Secondary | ICD-10-CM | POA: Diagnosis not present

## 2018-08-04 DIAGNOSIS — L02412 Cutaneous abscess of left axilla: Secondary | ICD-10-CM | POA: Diagnosis present

## 2018-08-04 MED ORDER — LIDOCAINE-EPINEPHRINE (PF) 2 %-1:200000 IJ SOLN: 20.0000 mL | Freq: Once | INTRAMUSCULAR | Status: DC

## 2018-08-04 MED ORDER — SULFAMETHOXAZOLE-TRIMETHOPRIM 800-160 MG PO TABS: 1.0000 | ORAL_TABLET | Freq: Two times a day (BID) | ORAL | 0 refills | Status: AC

## 2018-08-04 MED ORDER — TRAMADOL HCL 50 MG PO TABS: 50.0000 mg | ORAL_TABLET | Freq: Four times a day (QID) | ORAL | 0 refills | Status: DC | PRN

## 2018-08-04 NOTE — ED Notes (Signed)
Patient verbalizes understanding of discharge instructions. Opportunity for questioning and answers were provided. Armband removed by staff, pt discharged from ED. Ambulated out to lobby  

## 2018-08-04 NOTE — ED Provider Notes (Signed)
[facility] EMERGENCY DEPARTMENT Provider Note   CSN: [id number] Arrival date & time: 08/04/18  1023    History   Chief Complaint Chief Complaint  Patient presents with  . Abscess    HPI Samantha Bradford is a 33 y.o. female.     HPI   33 year old female presents today with abscess to her left armpit.  She notes symptoms started approximately 4 ago.  She reports pain, reports history of the same.  She denies any history of recent skin infections but notes axilla abscess.  She denies fever.  Past Medical History:  Diagnosis Date  . Depression   . GERD (gastroesophageal reflux disease)   . Sickle cell trait [facility])     Patient Active Problem List   Diagnosis Date Noted  . Normal vaginal delivery 01/04/2012    No past surgical history on file.   OB History    Gravida  3   Para  3   Term  3   Preterm      AB      Living  3     SAB      TAB      Ectopic      Multiple      Live Births  1            Home Medications    Prior to Admission medications   Medication Sig Start Date End Date Taking? Authorizing Provider  meloxicam (MOBIC) 7.5 MG tablet Take 1 tablet (7.5 mg total) by mouth daily for 30 days. 07/25/18 08/24/18  Arlyn Dunning, PA-C  nitrofurantoin, macrocrystal-monohydrate, (MACROBID) 100 MG capsule Take 1 capsule (100 mg total) by mouth 2 (two) times daily. 07/25/18   Arlyn Dunning, PA-C  sulfamethoxazole-trimethoprim (BACTRIM DS,SEPTRA DS) 800-160 MG tablet Take 1 tablet by mouth 2 (two) times daily for 7 days. 08/04/18 08/11/18  Yerania Chamorro, Tinnie Gens, PA-C  traMADol (ULTRAM) 50 MG tablet Take 1 tablet (50 mg total) by mouth every 6 (six) hours as needed. 08/04/18   Eyvonne Mechanic, PA-C    Family History Family History  Problem Relation Age of Onset  . Asthma Mother   . Hypertension Mother   . Asthma Brother   . Asthma Son     Social History Social History   Tobacco Use  . Smoking status: Current Some Day Smoker   Types: Cigarettes  . Smokeless tobacco: Never Used  . Tobacco comment: Prior to pregnancy smokes 1-2 cigarettes a day  Substance Use Topics  . Alcohol use: No  . Drug use: Yes    Types: Marijuana    Comment: last used 6 weeks ago     Allergies   Patient has no known allergies.   Review of Systems Review of Systems  All other systems reviewed and are negative.    Physical Exam Updated Vital Signs BP 128/82 (BP Location: Right Arm)   Pulse 69   Temp 97.7 F (36.5 C) (Oral)   Resp 16   SpO2 99%   Physical Exam Musculoskeletal:     Comments: 1.5 cm area of induration to the left axilla no significant surrounding erythema-central fluctuance noted      ED Treatments / Results  Labs (all labs ordered are listed, but only abnormal results are displayed) Labs Reviewed - No data to display  EKG None  Radiology No results found.  Procedures .Marland KitchenIncision and Drainage Date/Time: 08/04/2018 3:29 PM Performed by: Eyvonne Mechanic, PA-C Authorized by: Eyvonne Mechanic, PA-C  Consent:    Consent obtained:  Verbal   Consent given by:  Patient   Risks discussed:  Bleeding, incomplete drainage, infection, pain and damage to other organs   Alternatives discussed:  Alternative treatment and no treatment Location:    Type:  Abscess   Size:  1.5   Location: left axilla. Pre-procedure details:    Procedure prep: alcohol  Anesthesia (see MAR for exact dosages):    Anesthesia method:  Local infiltration   Local anesthetic:  Lidocaine 2% WITH epi Procedure type:    Complexity:  Simple Procedure details:    Needle aspiration: no     Incision types:  Single straight   Incision depth:  Dermal   Scalpel blade:  11   Wound management:  Probed and deloculated   Drainage:  Purulent   Drainage amount:  Moderate   Wound treatment:  Wound left open   Packing materials:  None Post-procedure details:    Patient tolerance of procedure:  Tolerated well, no immediate complications    (including critical care time)  Medications Ordered in ED Medications - No data to display   Initial Impression / Assessment and Plan / ED Course  I have reviewed the triage vital signs and the nursing notes.  Pertinent labs & imaging results that were available during my care of the patient were reviewed by me and considered in my medical decision making (see chart for details).        Labs:   Imaging:  Consults:  Therapeutics:  Discharge Meds:   Assessment/Plan: 33 year old female presents today with abscess.  I&D with success.  Patient discharged home with antibiotics.  I have encouraged her to monitor for worsening signs or symptoms initiate antibiotics in that case.  Return immediately if she develops any new or worsening signs or symptoms.   Final Clinical Impressions(s) / ED Diagnoses   Final diagnoses:  Abscess    ED Discharge Orders         Ordered    traMADol (ULTRAM) 50 MG tablet  Every 6 hours PRN     08/04/18 1137    sulfamethoxazole-trimethoprim (BACTRIM DS,SEPTRA DS) 800-160 MG tablet  2 times daily     08/04/18 1137           Eyvonne Mechanic, Cordelia Poche 08/04/18 1531    Terrilee Files, MD 08/05/18 [phone]

## 2018-08-04 NOTE — ED Triage Notes (Signed)
Patient reports abscess to L axilla onset Monday, reports worse now. Denies fevers/chills.

## 2018-08-04 NOTE — Discharge Instructions (Addendum)
Please read attached information. If you experience any new or worsening signs or symptoms please return to the emergency room for evaluation. Please follow-up with your primary care provider or specialist as discussed. Please use medication prescribed only as directed and discontinue taking if you have any concerning signs or symptoms.   °

## 2018-09-27 ENCOUNTER — Encounter (HOSPITAL_COMMUNITY): Payer: Self-pay | Admitting: Emergency Medicine

## 2018-09-27 ENCOUNTER — Emergency Department (HOSPITAL_COMMUNITY)
Admission: EM | Admit: 2018-09-27 | Discharge: 2018-09-27 | Disposition: A | Discharge status: Home / Self Care | Payer: Medicare Other | Attending: Emergency Medicine | Admitting: Emergency Medicine

## 2018-09-27 ENCOUNTER — Other Ambulatory Visit: Payer: Self-pay

## 2018-09-27 ENCOUNTER — Emergency Department (HOSPITAL_COMMUNITY): Payer: Medicare Other

## 2018-09-27 DIAGNOSIS — M25552 Pain in left hip: Secondary | ICD-10-CM | POA: Insufficient documentation

## 2018-09-27 DIAGNOSIS — F17210 Nicotine dependence, cigarettes, uncomplicated: Secondary | ICD-10-CM | POA: Diagnosis not present

## 2018-09-27 DIAGNOSIS — Y939 Activity, unspecified: Secondary | ICD-10-CM | POA: Insufficient documentation

## 2018-09-27 DIAGNOSIS — Z79899 Other long term (current) drug therapy: Secondary | ICD-10-CM | POA: Insufficient documentation

## 2018-09-27 DIAGNOSIS — Y92481 Parking lot as the place of occurrence of the external cause: Secondary | ICD-10-CM | POA: Insufficient documentation

## 2018-09-27 DIAGNOSIS — D573 Sickle-cell trait: Secondary | ICD-10-CM | POA: Insufficient documentation

## 2018-09-27 DIAGNOSIS — M79652 Pain in left thigh: Secondary | ICD-10-CM | POA: Diagnosis not present

## 2018-09-27 DIAGNOSIS — M542 Cervicalgia: Secondary | ICD-10-CM | POA: Insufficient documentation

## 2018-09-27 DIAGNOSIS — M25562 Pain in left knee: Secondary | ICD-10-CM | POA: Diagnosis not present

## 2018-09-27 DIAGNOSIS — R0789 Other chest pain: Secondary | ICD-10-CM | POA: Diagnosis not present

## 2018-09-27 DIAGNOSIS — Y999 Unspecified external cause status: Secondary | ICD-10-CM | POA: Diagnosis not present

## 2018-09-27 DIAGNOSIS — M25512 Pain in left shoulder: Secondary | ICD-10-CM | POA: Insufficient documentation

## 2018-09-27 DIAGNOSIS — T07XXXA Unspecified multiple injuries, initial encounter: Secondary | ICD-10-CM | POA: Diagnosis present

## 2018-09-27 DIAGNOSIS — R52 Pain, unspecified: Secondary | ICD-10-CM

## 2018-09-27 LAB — POC URINE PREG, ED: Preg Test, Ur: NEGATIVE

## 2018-09-27 MED ORDER — NAPROXEN 500 MG PO TABS: 500.0000 mg | ORAL_TABLET | Freq: Two times a day (BID) | ORAL | 0 refills | Status: DC

## 2018-09-27 MED ORDER — ACETAMINOPHEN 325 MG PO TABS
650.0000 mg | ORAL_TABLET | Freq: Once | ORAL | Status: AC
  Administered 2018-09-27: 650 mg via ORAL
  Filled 2018-09-27: qty 2

## 2018-09-27 MED ORDER — METHOCARBAMOL 500 MG PO TABS: 500.0000 mg | ORAL_TABLET | Freq: Three times a day (TID) | ORAL | 0 refills | Status: DC | PRN
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
## 2018-09-27 NOTE — ED Notes (Signed)
Patient transported to X-ray 

## 2019-03-12 ENCOUNTER — Encounter (HOSPITAL_COMMUNITY): Payer: Self-pay

## 2019-03-12 ENCOUNTER — Other Ambulatory Visit: Payer: Self-pay

## 2019-03-12 ENCOUNTER — Ambulatory Visit (HOSPITAL_COMMUNITY)
Admission: EM | Admit: 2019-03-12 | Discharge: 2019-03-12 | Disposition: A | Discharge status: Home / Self Care | Payer: Medicare Other | Attending: Emergency Medicine | Admitting: Emergency Medicine

## 2019-03-12 DIAGNOSIS — R05 Cough: Secondary | ICD-10-CM

## 2019-03-12 DIAGNOSIS — R0981 Nasal congestion: Secondary | ICD-10-CM | POA: Diagnosis not present

## 2019-03-12 DIAGNOSIS — J069 Acute upper respiratory infection, unspecified: Secondary | ICD-10-CM

## 2019-03-12 DIAGNOSIS — R1031 Right lower quadrant pain: Secondary | ICD-10-CM

## 2019-03-12 DIAGNOSIS — Z3202 Encounter for pregnancy test, result negative: Secondary | ICD-10-CM | POA: Diagnosis not present

## 2019-03-12 LAB — POCT URINALYSIS DIP (DEVICE)
Glucose, UA: NEGATIVE mg/dL
Hgb urine dipstick: NEGATIVE
Ketones, ur: 40 mg/dL — AB
Leukocytes,Ua: NEGATIVE
Nitrite: NEGATIVE
Protein, ur: 100 mg/dL — AB
Specific Gravity, Urine: >=1.03 (ref 1.005–1.030)
Urobilinogen, UA: 2 mg/dL — ABNORMAL HIGH (ref 0.0–1.0)
pH: 6 (ref 5.0–8.0)

## 2019-03-12 LAB — POCT PREGNANCY, URINE: Preg Test, Ur: NEGATIVE

## 2019-03-12 MED ORDER — AMOXICILLIN-POT CLAVULANATE 875-125 MG PO TABS: 1.0000 | ORAL_TABLET | Freq: Two times a day (BID) | ORAL | 0 refills | Status: AC

## 2019-03-12 NOTE — ED Provider Notes (Signed)
[facility]    CSN: [id number] Arrival date & time: 03/12/19  1303      History   Chief Complaint Chief Complaint  Patient presents with  . Abdominal Pain  . Nasal Congestion  . Cough    HPI Samantha Bradford is a 33 y.o. female.   Samantha Bradford presents with complaints of a "cold", cough, sinus congestion. Cough causes chest pain . Cough is productive. Sharp abdominal pain which comes and goes and goes to back. Cold started three days ago. Abdominal pain last week. No urinary symptoms. Nausea, no vomiting or diarrhea. Her period is late. No vaginal bleeding. LMP 9/6-9/8. Periods are typically regular. Sexually active, 1 partner. No condoms. No birth control. Trying for pregnancy. Has three children. Hasn't taken any medications for her symptoms. No ear pain or sore throat. No known ill contacts. Works at [employer], declines covid testing here today. States was referred for ultrasound by her doctor in [place], but since recently moved here to [place] her insurance wouldn't work. This was last month. There was concern for ovarian cyst at that time. States she doesn't have local doctors.     ROS per HPI, negative if not otherwise mentioned.      Past Medical History:  Diagnosis Date  . Depression   . GERD (gastroesophageal reflux disease)   . Sickle cell trait [facility](H[facility])     Patient Active Problem List   Diagnosis Date Noted  . Normal vaginal delivery 01/04/2012    History reviewed. No pertinent surgical history.  OB History    Gravida  3   Para  3   Term  3   Preterm      AB      Living  3     SAB      TAB      Ectopic      Multiple      Live Births  1            Home Medications    Prior to Admission medications   Medication Sig Start Date End Date Taking? Authorizing Provider  amoxicillin-clavulanate (AUGMENTIN) 875-125 MG tablet Take 1 tablet by mouth every 12 (twelve) hours for 10 days. 03/17/19 03/27/19  Georgetta HaberBurky, La Dibella  B, NP  methocarbamol (ROBAXIN) 500 MG tablet Take 1 tablet (500 mg total) by mouth every 8 (eight) hours as needed for muscle spasms. 09/27/18   Petrucelli, Samantha R, PA-C  naproxen (NAPROSYN) 500 MG tablet Take 1 tablet (500 mg total) by mouth 2 (two) times daily. 09/27/18   Petrucelli, Samantha R, PA-C  nitrofurantoin, macrocrystal-monohydrate, (MACROBID) 100 MG capsule Take 1 capsule (100 mg total) by mouth 2 (two) times daily. 07/25/18   Arlyn DunningMcLean, Kelly A, PA-C  traMADol (ULTRAM) 50 MG tablet Take 1 tablet (50 mg total) by mouth every 6 (six) hours as needed. 08/04/18   Eyvonne MechanicHedges, Jeffrey, PA-C    Family History Family History  Problem Relation Age of Onset  . Asthma Mother   . Hypertension Mother   . Asthma Brother   . Asthma Son     Social History Social History   Tobacco Use  . Smoking status: Current Some Day Smoker    Types: Cigarettes  . Smokeless tobacco: Never Used  . Tobacco comment: Prior to pregnancy smokes 1-2 cigarettes a day  Substance Use Topics  . Alcohol use: No  . Drug use: Yes    Types: Marijuana    Comment: last used 6 weeks ago  Allergies   Patient has no known allergies.   Review of Systems Review of Systems   Physical Exam Triage Vital Signs ED Triage Vitals  Enc Vitals Group     BP 03/12/19 1353 (!) 142/71     Pulse Rate 03/12/19 1353 77     Resp 03/12/19 1353 16     Temp 03/12/19 1353 98 F (36.7 C)     Temp Source 03/12/19 1353 Oral     SpO2 03/12/19 1353 99 %     Weight --      Height --      Head Circumference --      Peak Flow --      Pain Score 03/12/19 1354 3     Pain Loc --      Pain Edu? --      Excl. in [place]? --    No data found.  Updated Vital Signs BP (!) 142/71 (BP Location: Right Arm)   Pulse 77   Temp 98 F (36.7 C) (Oral)   Resp 16   SpO2 99%    Physical Exam Constitutional:      General: She is not in acute distress.    Appearance: She is well-developed.  HENT:     Head: Normocephalic and atraumatic.      Nose: Mucosal edema and rhinorrhea present.  Cardiovascular:     Rate and Rhythm: Normal rate.     Heart sounds: Normal heart sounds.  Pulmonary:     Effort: Pulmonary effort is normal.  Abdominal:     Palpations: Abdomen is soft.     Tenderness: There is abdominal tenderness in the right lower quadrant. There is no right CVA tenderness, left CVA tenderness, guarding or rebound.     Comments: Very mild rlq abdominal pain on palpation; moving about the room without difficulty and in no apparent distress   Genitourinary:    Comments: Denies any vaginal bleeding  Skin:    General: Skin is warm and dry.  Neurological:     Mental Status: She is alert and oriented to person, place, and time.      UC Treatments / Results  Labs (all labs ordered are listed, but only abnormal results are displayed) Labs Reviewed  POCT URINALYSIS DIP (DEVICE) - Abnormal; Notable for the following components:      Result Value   Bilirubin Urine SMALL (*)    Ketones, ur 40 (*)    Protein, ur 100 (*)    Urobilinogen, UA 2.0 (*)    All other components within normal limits  POCT PREGNANCY, URINE    EKG   Radiology No results found.  Procedures Procedures (including critical care time)  Medications Ordered in UC Medications - No data to display  Initial Impression / Assessment and Plan / UC Course  I have reviewed the triage vital signs and the nursing notes.  Pertinent labs & imaging results that were available during my care of the patient were reviewed by me and considered in my medical decision making (see chart for details).     Non toxic, afebrile. ua doesn't indicate infection, and pregnancy negative here today. Mild rlq abdominal pain, has been coming and going, was previously referred for abdominal ultrasound- I do think this is still warranted, I do not feel this is acute today. Encouraged follow up with local gyne. URI symptoms for 3 days, afebrile, no increased work of breathing.  History and physical consistent with viral illness.  Declines covid testing. Recommended  as works at a [employer]. Over the counter treatments as needed. Patient concerned about antibiotic need. Antibiotics provided for end of the week once 10 days of symptoms at least, discussed to not take if symptoms improving. Return precautions provided. Patient verbalized understanding and agreeable to plan.   Final Clinical Impressions(s) / UC Diagnoses   Final diagnoses:  Upper respiratory tract infection, unspecified type  Negative pregnancy test     Discharge Instructions     Negative pregnancy here today. I do recommend following up with Ob/Gyne for recheck.  Your cough and congestion is likely viral in nature. During current pandemic I do recommend Covid-19 rule out.  Push fluids to ensure adequate hydration and keep secretions thin.  Tylenol and/or ibuprofen as needed for pain or fevers.  Over the counter treatments as needed for symptoms.  I do recommend use of daily Flonase or Nasacort.  If no improvement or any worsening of sinus symptoms by Friday you may start antibiotics.     ED Prescriptions    Medication Sig Dispense Auth. Provider   amoxicillin-clavulanate (AUGMENTIN) 875-125 MG tablet Take 1 tablet by mouth every 12 (twelve) hours for 10 days. 20 tablet Georgetta Haber, NP     PDMP not reviewed this encounter.   Georgetta Haber, NP 03/12/19 1459

## 2019-03-12 NOTE — ED Triage Notes (Signed)
Pt present abdominal pain with cramping, pt is also late for her  Menstruation. Pt present cough with nasal congestion and chest congestion.

## 2019-03-12 NOTE — Discharge Instructions (Signed)
Negative pregnancy here today. I do recommend following up with Ob/Gyne for recheck.  Your cough and congestion is likely viral in nature. During current pandemic I do recommend Covid-19 rule out.  Push fluids to ensure adequate hydration and keep secretions thin.  Tylenol and/or ibuprofen as needed for pain or fevers.  Over the counter treatments as needed for symptoms.  I do recommend use of daily Flonase or Nasacort.  If no improvement or any worsening of sinus symptoms by Friday you may start antibiotics.

## 2019-04-12 ENCOUNTER — Ambulatory Visit: Payer: Medicare Other | Admitting: Internal Medicine

## 2019-05-27 ENCOUNTER — Inpatient Hospital Stay (HOSPITAL_COMMUNITY)
Admission: AD | Admit: 2019-05-27 | Discharge: 2019-05-27 | Disposition: A | Discharge status: Home / Self Care | Payer: Medicare Other | Attending: Obstetrics and Gynecology | Admitting: Obstetrics and Gynecology

## 2019-05-27 ENCOUNTER — Other Ambulatory Visit: Payer: Self-pay

## 2019-05-27 ENCOUNTER — Inpatient Hospital Stay (HOSPITAL_COMMUNITY): Payer: Medicare Other

## 2019-05-27 ENCOUNTER — Encounter (HOSPITAL_COMMUNITY): Payer: Self-pay | Admitting: Obstetrics and Gynecology

## 2019-05-27 ENCOUNTER — Emergency Department (HOSPITAL_COMMUNITY)
Admission: EM | Admit: 2019-05-27 | Discharge: 2019-05-27 | Discharge status: Against Medical Advice | Payer: Medicare Other

## 2019-05-27 DIAGNOSIS — O26891 Other specified pregnancy related conditions, first trimester: Secondary | ICD-10-CM | POA: Diagnosis present

## 2019-05-27 DIAGNOSIS — F17210 Nicotine dependence, cigarettes, uncomplicated: Secondary | ICD-10-CM | POA: Diagnosis not present

## 2019-05-27 DIAGNOSIS — Z3A01 Less than 8 weeks gestation of pregnancy: Secondary | ICD-10-CM | POA: Diagnosis not present

## 2019-05-27 DIAGNOSIS — O99011 Anemia complicating pregnancy, first trimester: Secondary | ICD-10-CM | POA: Insufficient documentation

## 2019-05-27 DIAGNOSIS — O99331 Smoking (tobacco) complicating pregnancy, first trimester: Secondary | ICD-10-CM | POA: Diagnosis not present

## 2019-05-27 DIAGNOSIS — O3680X0 Pregnancy with inconclusive fetal viability, not applicable or unspecified: Secondary | ICD-10-CM | POA: Diagnosis not present

## 2019-05-27 DIAGNOSIS — R109 Unspecified abdominal pain: Secondary | ICD-10-CM | POA: Diagnosis not present

## 2019-05-27 DIAGNOSIS — D573 Sickle-cell trait: Secondary | ICD-10-CM | POA: Diagnosis not present

## 2019-05-27 DIAGNOSIS — R110 Nausea: Secondary | ICD-10-CM | POA: Diagnosis not present

## 2019-05-27 LAB — WET PREP, GENITAL
Sperm: NONE SEEN
Trich, Wet Prep: NONE SEEN
Yeast Wet Prep HPF POC: NONE SEEN

## 2019-05-27 LAB — URINALYSIS, ROUTINE W REFLEX MICROSCOPIC
Bilirubin Urine: NEGATIVE
Glucose, UA: NEGATIVE mg/dL
Hgb urine dipstick: NEGATIVE
Ketones, ur: NEGATIVE mg/dL
Leukocytes,Ua: NEGATIVE
Nitrite: NEGATIVE
Protein, ur: NEGATIVE mg/dL
Specific Gravity, Urine: 1.028 (ref 1.005–1.030)
pH: 5 (ref 5.0–8.0)

## 2019-05-27 LAB — CBC
HCT: 35.9 % — ABNORMAL LOW (ref 36.0–46.0)
Hemoglobin: 12.5 g/dL (ref 12.0–15.0)
MCH: 28.6 pg (ref 26.0–34.0)
MCHC: 34.8 g/dL (ref 30.0–36.0)
MCV: 82.2 fL (ref 80.0–100.0)
Platelets: 322 10*3/uL (ref 150–400)
RBC: 4.37 MIL/uL (ref 3.87–5.11)
RDW: 13.4 % (ref 11.5–15.5)
WBC: 7 10*3/uL (ref 4.0–10.5)
nRBC: 0 % (ref 0.0–0.2)

## 2019-05-27 LAB — POCT PREGNANCY, URINE: Preg Test, Ur: POSITIVE — AB

## 2019-05-27 LAB — HCG, QUANTITATIVE, PREGNANCY: hCG, Beta Chain, Quant, S: 1086 m[IU]/mL — ABNORMAL HIGH (ref <5)

## 2019-05-27 NOTE — MAU Provider Note (Signed)
Chief Complaint: Abdominal Pain and Back Pain   First Provider Initiated Contact with Patient 05/27/19 0537     SUBJECTIVE HPI: Samantha Bradford is a 34 y.o. [id number] at [redacted]w[redacted]d who presents to Maternity Admissions reporting abdominal pain & pressure. Symptoms started a few days ago. Reports pressure in her lower abdomen that is worse on the right side and at times radiates to her low back. Had a positive pregnancy test yesterday. Endorses some nausea. Denies fever/chills, diarrhea, dysuria, vaginal bleeding, or vaginal discharge. Pain worse with walking and lying on her right side.   Location: abdomen Quality: pressure, aching Severity: 7/10 on pain scale Duration: 2 days Timing: intermittent Modifying factors: worse with walking Associated signs and symptoms: none  Past Medical History:  Diagnosis Date  . Depression   . GERD (gastroesophageal reflux disease)   . Sickle cell trait (HCC)    OB History  Gravida Para Term Preterm AB Living  4 3 3     3   SAB TAB Ectopic Multiple Live Births          1    # Outcome Date GA Lbr Len/2nd Weight Sex Delivery Anes PTL Lv  4 Current           3 Term 01/04/12 [redacted]w[redacted]d 35:50 / 00:03 2850 g F Vag-Spont None  LIV  2 Term      Vag-Spont     1 Term      Vag-Spont      History reviewed. No pertinent surgical history. Social History   Socioeconomic History  . Marital status: Legally Separated    Spouse name: Not on file  . Number of children: Not on file  . Years of education: Not on file  . Highest education level: Not on file  Occupational History  . Not on file  Tobacco Use  . Smoking status: Current Every Day Smoker    Types: Cigarettes  . Smokeless tobacco: Never Used  Substance and Sexual Activity  . Alcohol use: No  . Drug use: Yes    Types: Marijuana    Comment: last used 2 days ago  . Sexual activity: Yes  Other Topics Concern  . Not on file  Social History Narrative  . Not on file   Social Determinants of Health    Financial Resource Strain:   . Difficulty of Paying Living Expenses: Not on file  Food Insecurity:   . Worried About [redacted]w[redacted]d in the Last Year: Not on file  . Ran Out of Food in the Last Year: Not on file  Transportation Needs:   . Lack of Transportation (Medical): Not on file  . Lack of Transportation (Non-Medical): Not on file  Physical Activity:   . Days of Exercise per Week: Not on file  . Minutes of Exercise per Session: Not on file  Stress:   . Feeling of Stress : Not on file  Social Connections:   . Frequency of Communication with Friends and Family: Not on file  . Frequency of Social Gatherings with Friends and Family: Not on file  . Attends Religious Services: Not on file  . Active Member of Clubs or Organizations: Not on file  . Attends Programme researcher, broadcasting/film/video Meetings: Not on file  . Marital Status: Not on file  Intimate Partner Violence:   . Fear of Current or Ex-Partner: Not on file  . Emotionally Abused: Not on file  . Physically Abused: Not on file  . Sexually Abused: Not on file  Family History  Problem Relation Age of Onset  . Asthma Mother   . Hypertension Mother   . Asthma Brother   . Asthma Son    No current facility-administered medications on file prior to encounter.   No current outpatient medications on file prior to encounter.   No Known Allergies  I have reviewed patient's Past Medical Hx, Surgical Hx, Family Hx, Social Hx, medications and allergies.   Review of Systems  Constitutional: Negative.   Gastrointestinal: Positive for abdominal pain and nausea. Negative for constipation, diarrhea and vomiting.  Genitourinary: Negative.     OBJECTIVE Patient Vitals for the past 24 hrs:  BP Temp Temp src Pulse Resp  05/27/19 0649 (!) 117/57 - - 66 17  05/27/19 0523 112/63 98.4 F (36.9 C) Oral 75 17   Constitutional: Well-developed, well-nourished female in no acute distress.  Cardiovascular: normal rate & rhythm, no  murmur Respiratory: normal rate and effort. Lung sounds clear throughout GI: Abd soft, non-tender, Pos BS x 4. No guarding or rebound tenderness MS: Extremities nontender, no edema, normal ROM Neurologic: Alert and oriented x 4.    LAB RESULTS Results for orders placed or performed during the hospital encounter of 05/27/19 (from the past 24 hour(s))  Urinalysis, Routine w reflex microscopic     Status: Abnormal   Collection Time: 05/27/19  4:46 AM  Result Value Ref Range   Color, Urine YELLOW YELLOW   APPearance HAZY (A) CLEAR   Specific Gravity, Urine 1.028 1.005 - 1.030   pH 5.0 5.0 - 8.0   Glucose, UA NEGATIVE NEGATIVE mg/dL   Hgb urine dipstick NEGATIVE NEGATIVE   Bilirubin Urine NEGATIVE NEGATIVE   Ketones, ur NEGATIVE NEGATIVE mg/dL   Protein, ur NEGATIVE NEGATIVE mg/dL   Nitrite NEGATIVE NEGATIVE   Leukocytes,Ua NEGATIVE NEGATIVE   RBC / HPF 0-5 0 - 5 RBC/hpf   WBC, UA 0-5 0 - 5 WBC/hpf   Bacteria, UA MANY (A) NONE SEEN   Squamous Epithelial / LPF 0-5 0 - 5   Mucus PRESENT   Pregnancy, urine POC     Status: Abnormal   Collection Time: 05/27/19  4:47 AM  Result Value Ref Range   Preg Test, Ur POSITIVE (A) NEGATIVE  Wet prep, genital     Status: Abnormal   Collection Time: 05/27/19  5:48 AM  Result Value Ref Range   Yeast Wet Prep HPF POC NONE SEEN NONE SEEN   Trich, Wet Prep NONE SEEN NONE SEEN   Clue Cells Wet Prep HPF POC PRESENT (A) NONE SEEN   WBC, Wet Prep HPF POC FEW (A) NONE SEEN   Sperm NONE SEEN   CBC     Status: Abnormal   Collection Time: 05/27/19  5:56 AM  Result Value Ref Range   WBC 7.0 4.0 - 10.5 K/uL   RBC 4.37 3.87 - 5.11 MIL/uL   Hemoglobin 12.5 12.0 - 15.0 g/dL   HCT 35.9 (L) 36.0 - 46.0 %   MCV 82.2 80.0 - 100.0 fL   MCH 28.6 26.0 - 34.0 pg   MCHC 34.8 30.0 - 36.0 g/dL   RDW 13.4 11.5 - 15.5 %   Platelets 322 150 - 400 K/uL   nRBC 0.0 0.0 - 0.2 %  hCG, quantitative, pregnancy     Status: Abnormal   Collection Time: 05/27/19  5:56 AM   Result Value Ref Range   hCG, Beta Chain, Quant, S 1,086 (H) <5 mIU/mL    IMAGING US OB LESS THAN  14 WEEKS WITH OB TRANSVAGINAL  Result Date: 05/27/2019 CLINICAL DATA:  Pregnant patient in first-trimester pregnancy with abdominal pain. EXAM: OBSTETRIC <14 WK [place] AND TRANSVAGINAL OB US TECHNIQUE: Both transabdominal and transvaginal ultrasound examinations were performed for complete evaluation of the gestation as well as the maternal uterus, adnexal regions, and pelvic cul-de-sac. Transvaginal technique was performed to assess early pregnancy. COMPARISON:  None this pregnancy. FINDINGS: Intrauterine gestational sac: None Yolk sac:  Not Visualized. Embryo:  Not Visualized. Cardiac Activity: Not Visualized. Subchorionic hemorrhage:  Not applicable. Maternal uterus/adnexae: Uterus is anteverted. No intrauterine gestational sac or fluid in the endometrial canal. Right ovary is normal measuring 3.1 x 1.7 x 2.1 cm. Blood flow is noted. The left ovary is not visualized. No suspicious adnexal mass. Small volume pelvic free fluid. IMPRESSION: No intrauterine pregnancy or findings suspicious for ectopic pregnancy. Findings are consistent with pregnancy of unknown location and may reflect early intrauterine pregnancy not yet visualized sonographically, occult ectopic pregnancy, or failed pregnancy. Recommend trending of beta HCG and follow-up ultrasound in 7-10 days as clinically indicated. Electronically Signed   By: Narda Rutherford M.D.   On: 05/27/2019 06:49    MAU COURSE Orders Placed This Encounter  Procedures  . Wet prep, genital  . US OB LESS THAN 14 WEEKS WITH OB TRANSVAGINAL  . Urinalysis, Routine w reflex microscopic  . CBC  . hCG, quantitative, pregnancy  . Pregnancy, urine POC  . Discharge patient   No orders of the defined types were placed in this encounter.   MDM +UPT UA, wet prep, GC/chlamydia, CBC, ABO/Rh, quant hCG, and [place] today to rule out ectopic pregnancy which can be life  threatening.   Ultrasound shows no iup or adnexal mass. HCG today is 1086 This abdominal pain could represent a normal pregnancy, spontaneous abortion, or even an ectopic pregnancy which can be life-threatening. Cultures were obtained to rule out pelvic infection.  Will bring patient back on Monday for repeat HCG.   ASSESSMENT 1. Pregnancy of unknown anatomic location   2. Abdominal pain during pregnancy in first trimester     PLAN Discharge home in stable condition. Ectopic precautions GC/CT pending Pt will return to MAU on Monday for HCG - states can't come to during office hours  Follow-up Information    [facility] Follow up.   Specialty: Obstetrics and Gynecology Why: return on Monday for lab work Contact information: [address] [id number] Wilhe[place] [address] [phone]         Allergies as of 05/27/2019   No Known Allergies     Medication List    STOP taking these medications   methocarbamol 500 MG tablet Commonly known as: ROBAXIN   naproxen 500 MG tablet Commonly known as: NAPROSYN   nitrofurantoin (macrocrystal-monohydrate) 100 MG capsule Commonly known as: MACROBID   traMADol 50 MG tablet Commonly known as: Alease Frame, Denny Peon, NP 05/27/2019  7:18 AM

## 2019-05-27 NOTE — ED Triage Notes (Signed)
Pt called in waiting room no answer

## 2019-05-27 NOTE — Discharge Instructions (Signed)
Return to care   If you have heavier bleeding that soaks through more that 2 pads per hour for an hour or more  If you bleed so much that you feel like you might pass out or you do pass out  If you have significant abdominal pain that is not improved with Tylenol   If you develop a fever > 100.5   Abdominal Pain During Pregnancy  Belly (abdominal) pain is common during pregnancy. There are many possible causes. Most of the time, it is not a serious problem. Other times, it can be a sign that something is wrong with the pregnancy. Always tell your doctor if you have belly pain. Follow these instructions at home:  Do not have sex or put anything in your vagina until your pain goes away completely.  Get plenty of rest until your pain gets better.  Drink enough fluid to keep your pee (urine) pale yellow.  Take over-the-counter and prescription medicines only as told by your doctor.  Keep all follow-up visits as told by your doctor. This is important. Contact a doctor if:  Your pain continues or gets worse after resting.  You have lower belly pain that: ? Comes and goes at regular times. ? Spreads to your back. ? Feels like menstrual cramps.  You have pain or burning when you pee (urinate). Get help right away if:  You have a fever or chills.  You have vaginal bleeding.  You are leaking fluid from your vagina.  You are passing tissue from your vagina.  You throw up (vomit) for more than 24 hours.  You have watery poop (diarrhea) for more than 24 hours.  Your baby is moving less than usual.  You feel very weak or faint.  You have shortness of breath.  You have very bad pain in your upper belly. Summary  Belly (abdominal) pain is common during pregnancy. There are many possible causes.  If you have belly pain during pregnancy, tell your doctor right away.  Keep all follow-up visits as told by your doctor. This is important. This information is not intended to  replace advice given to you by your health care provider. Make sure you discuss any questions you have with your health care provider. Document Revised: 08/29/2018 Document Reviewed: 08/13/2016 Elsevier Patient Education  2020 [employer] Inc[employer]  

## 2019-05-27 NOTE — MAU Note (Signed)
Patient reports she has been feeling vaginal pressure and took a hpt and it came back positive.  Also reports right sided lower abdominal pain that is intermittent and is affecting her sleep.  No VB.  Also states it is painful for her during intercourse.

## 2019-05-29 LAB — GC/CHLAMYDIA PROBE AMP (~~LOC~~) NOT AT ARMC
Chlamydia: NEGATIVE
Comment: NEGATIVE
Comment: NORMAL
Neisseria Gonorrhea: NEGATIVE

## 2019-09-06 ENCOUNTER — Other Ambulatory Visit: Payer: Self-pay

## 2019-09-06 ENCOUNTER — Encounter (HOSPITAL_COMMUNITY): Payer: Self-pay | Admitting: Emergency Medicine

## 2019-09-06 ENCOUNTER — Emergency Department (HOSPITAL_COMMUNITY)
Admission: EM | Admit: 2019-09-06 | Discharge: 2019-09-06 | Disposition: A | Discharge status: Home / Self Care | Payer: Medicare Other | Attending: Emergency Medicine | Admitting: Emergency Medicine

## 2019-09-06 DIAGNOSIS — Z5321 Procedure and treatment not carried out due to patient leaving prior to being seen by health care provider: Secondary | ICD-10-CM | POA: Diagnosis not present

## 2019-09-06 DIAGNOSIS — R1031 Right lower quadrant pain: Secondary | ICD-10-CM | POA: Insufficient documentation

## 2019-09-06 DIAGNOSIS — M545 Low back pain: Secondary | ICD-10-CM | POA: Insufficient documentation

## 2019-09-06 LAB — COMPREHENSIVE METABOLIC PANEL
ALT: 15 U/L (ref 0–44)
AST: 17 U/L (ref 15–41)
Albumin: 3.7 g/dL (ref 3.5–5.0)
Alkaline Phosphatase: 58 U/L (ref 38–126)
Anion gap: 11 (ref 5–15)
BUN: 5 mg/dL — ABNORMAL LOW (ref 6–20)
CO2: 24 mmol/L (ref 22–32)
Calcium: 9 mg/dL (ref 8.9–10.3)
Chloride: 104 mmol/L (ref 98–111)
Creatinine, Ser: 1.01 mg/dL — ABNORMAL HIGH (ref 0.44–1.00)
GFR calc Af Amer: >60 mL/min (ref >60)
GFR calc non Af Amer: >60 mL/min (ref >60)
Glucose, Bld: 101 mg/dL — ABNORMAL HIGH (ref 70–99)
Potassium: 3.2 mmol/L — ABNORMAL LOW (ref 3.5–5.1)
Sodium: 139 mmol/L (ref 135–145)
Total Bilirubin: 0.7 mg/dL (ref 0.3–1.2)
Total Protein: 7.4 g/dL (ref 6.5–8.1)

## 2019-09-06 LAB — URINALYSIS, ROUTINE W REFLEX MICROSCOPIC
Bilirubin Urine: NEGATIVE
Glucose, UA: NEGATIVE mg/dL
Ketones, ur: 5 mg/dL — AB
Nitrite: NEGATIVE
Protein, ur: 30 mg/dL — AB
Specific Gravity, Urine: 1.018 (ref 1.005–1.030)
WBC, UA: >50 WBC/hpf — ABNORMAL HIGH (ref 0–5)
pH: 5 (ref 5.0–8.0)

## 2019-09-06 LAB — CBC
HCT: 38.7 % (ref 36.0–46.0)
Hemoglobin: 13.6 g/dL (ref 12.0–15.0)
MCH: 28.5 pg (ref 26.0–34.0)
MCHC: 35.1 g/dL (ref 30.0–36.0)
MCV: 81.1 fL (ref 80.0–100.0)
Platelets: 410 10*3/uL — ABNORMAL HIGH (ref 150–400)
RBC: 4.77 MIL/uL (ref 3.87–5.11)
RDW: 12.4 % (ref 11.5–15.5)
WBC: 9.4 10*3/uL (ref 4.0–10.5)
nRBC: 0 % (ref 0.0–0.2)

## 2019-09-06 LAB — LIPASE, BLOOD: Lipase: 20 U/L (ref 11–51)

## 2019-09-06 LAB — I-STAT BETA HCG BLOOD, ED (MC, WL, AP ONLY): I-stat hCG, quantitative: <5 m[IU]/mL (ref <5)

## 2019-09-06 MED ORDER — SODIUM CHLORIDE 0.9% FLUSH: 3.0000 mL | Freq: Once | INTRAVENOUS | Status: DC

## 2019-09-06 NOTE — ED Notes (Signed)
Pt called x3 for vitals with no response.  

## 2019-09-06 NOTE — ED Triage Notes (Signed)
Patient reports RLQ and right low back pain onset this week , no emesis or diarrhea , denies fever or chills .

## 2020-02-10 IMAGING — CR LEFT FEMUR 2 VIEWS
4 series · 4 of 4 positions shown · non-contrast
Comparison: None.

CLINICAL DATA: Status post fall, left femur pain

EXAM:
LEFT FEMUR 2 VIEWS

[femur ap (1 of 2)]
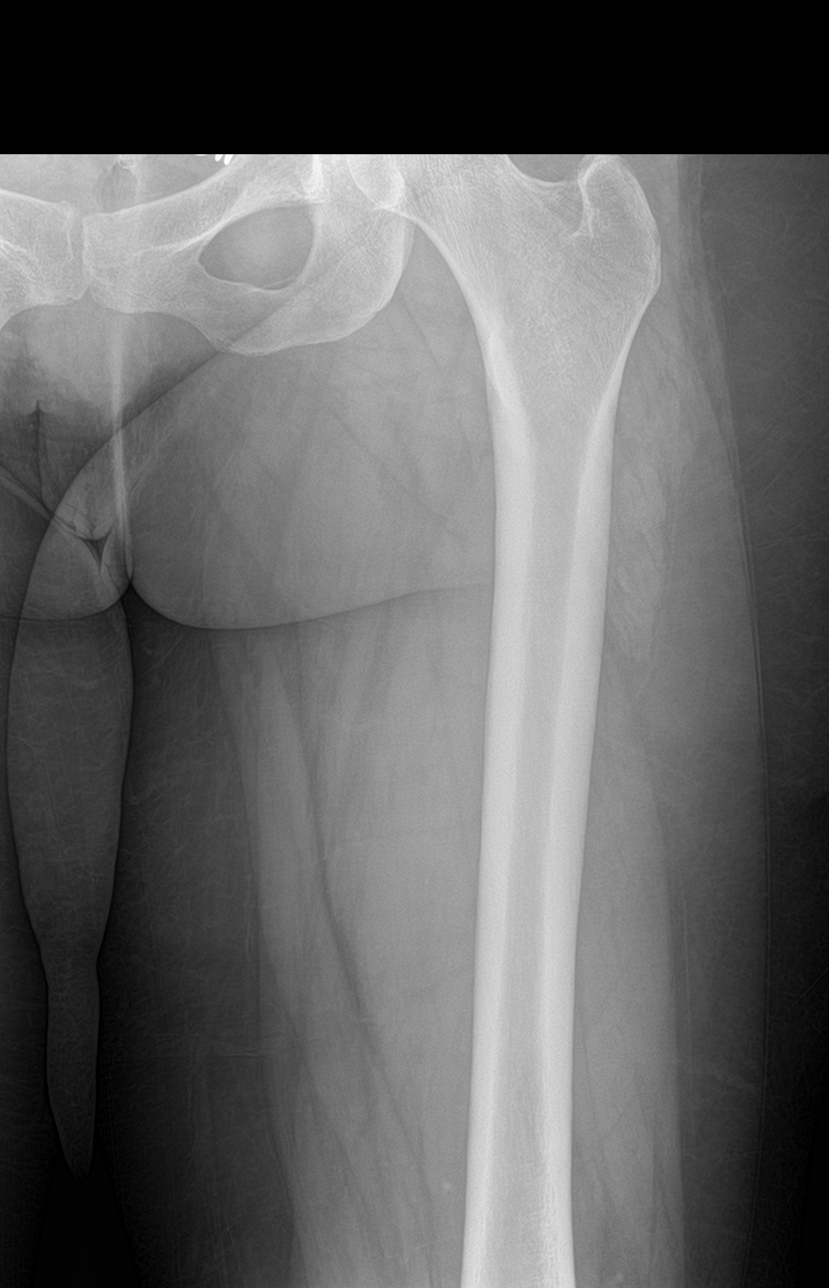

[femur ap (2 of 2)]
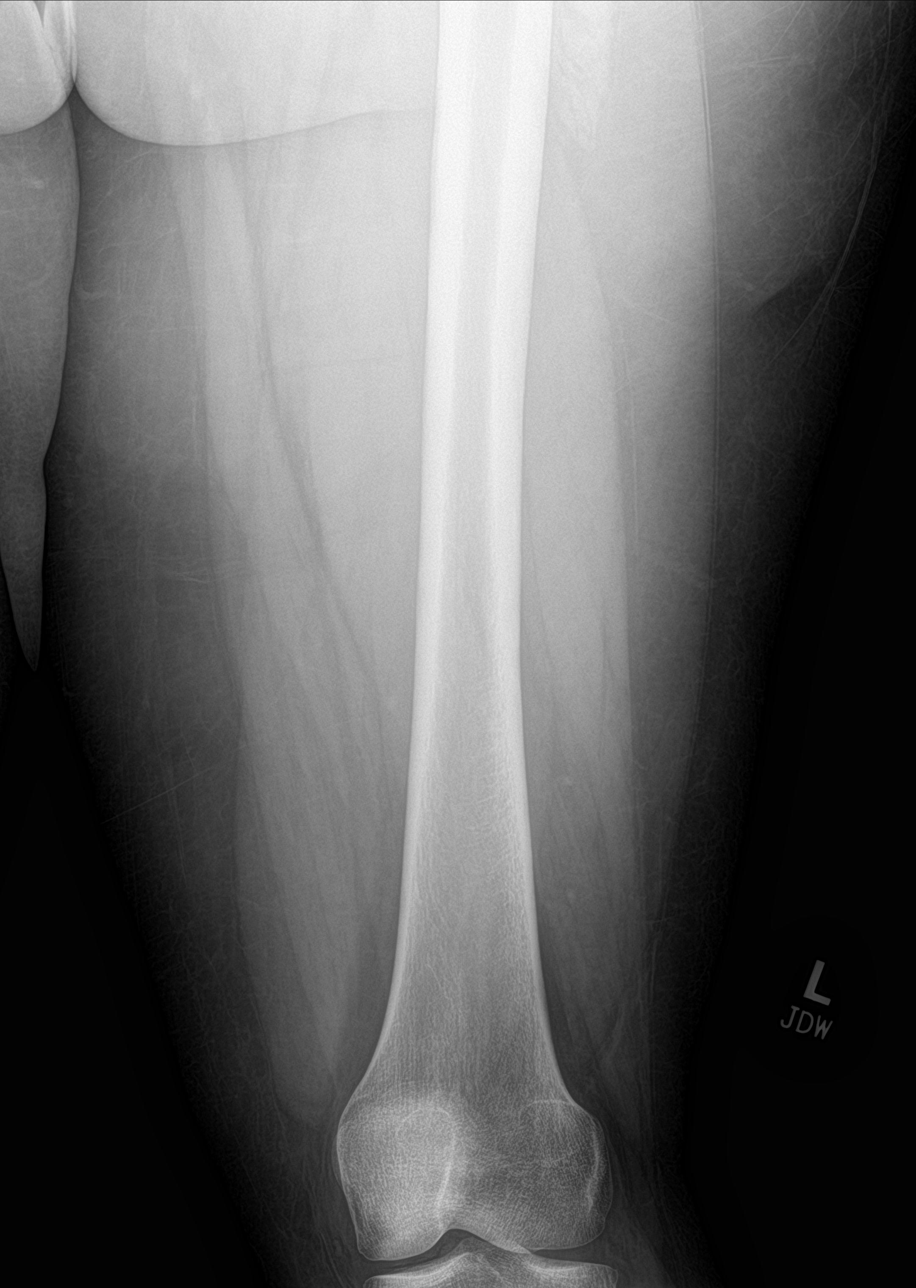

[femur lat (1 of 2)]
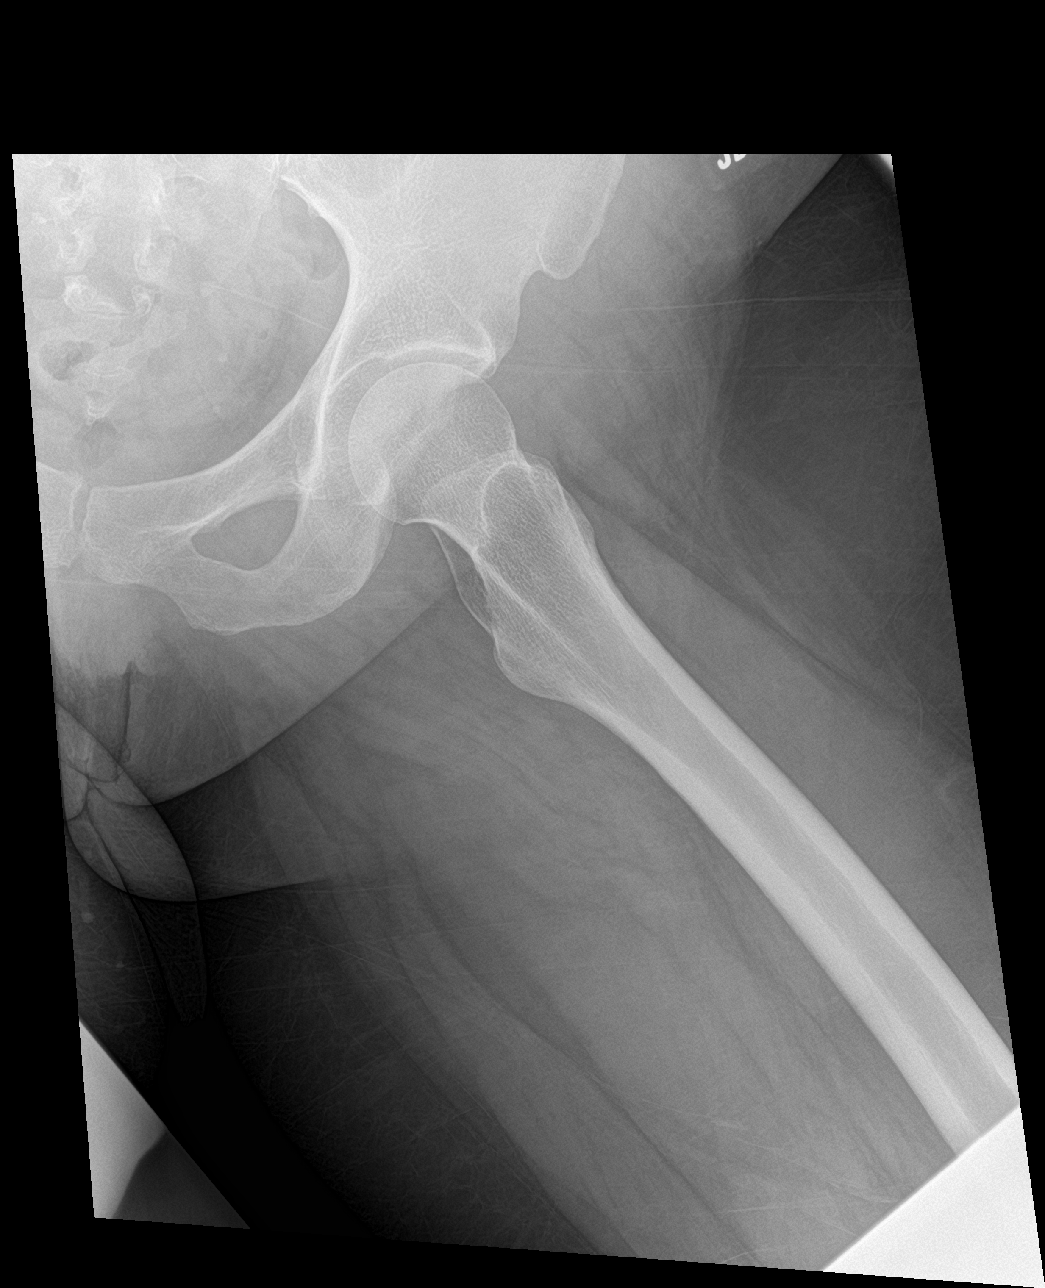

[femur lat (2 of 2)]
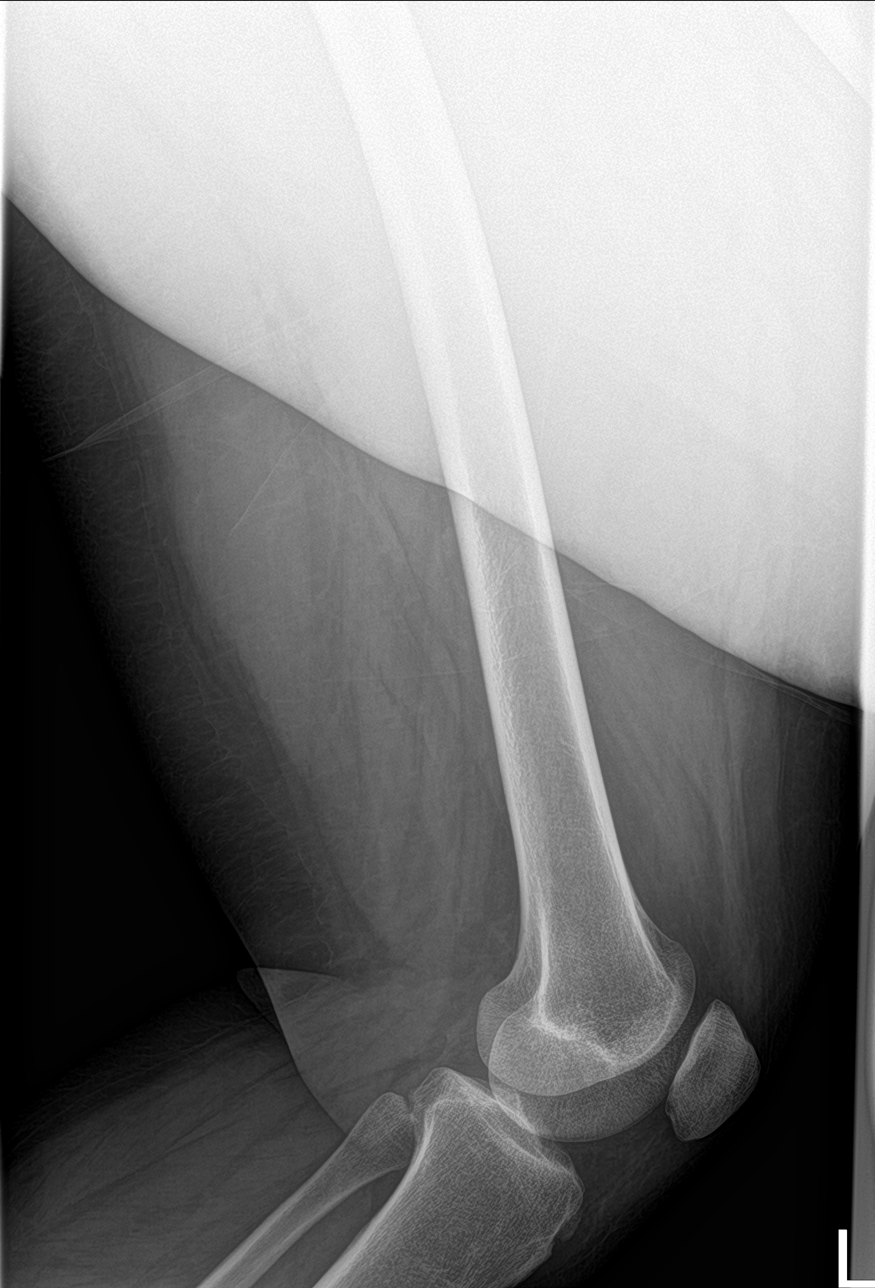

[4 of 4 positions shown; findings below may reference images not displayed]

FINDINGS: There is no evidence of fracture or other focal bone lesions. Soft
tissues are unremarkable.
IMPRESSION: Negative.

## 2020-02-10 IMAGING — CR LEFT KNEE - COMPLETE 4+ VIEW
4 series · 4 of 4 positions shown · non-contrast
Comparison: None.

CLINICAL DATA: Status post fall, left knee pain

EXAM:
LEFT KNEE - COMPLETE 4+ VIEW

[knee ap]
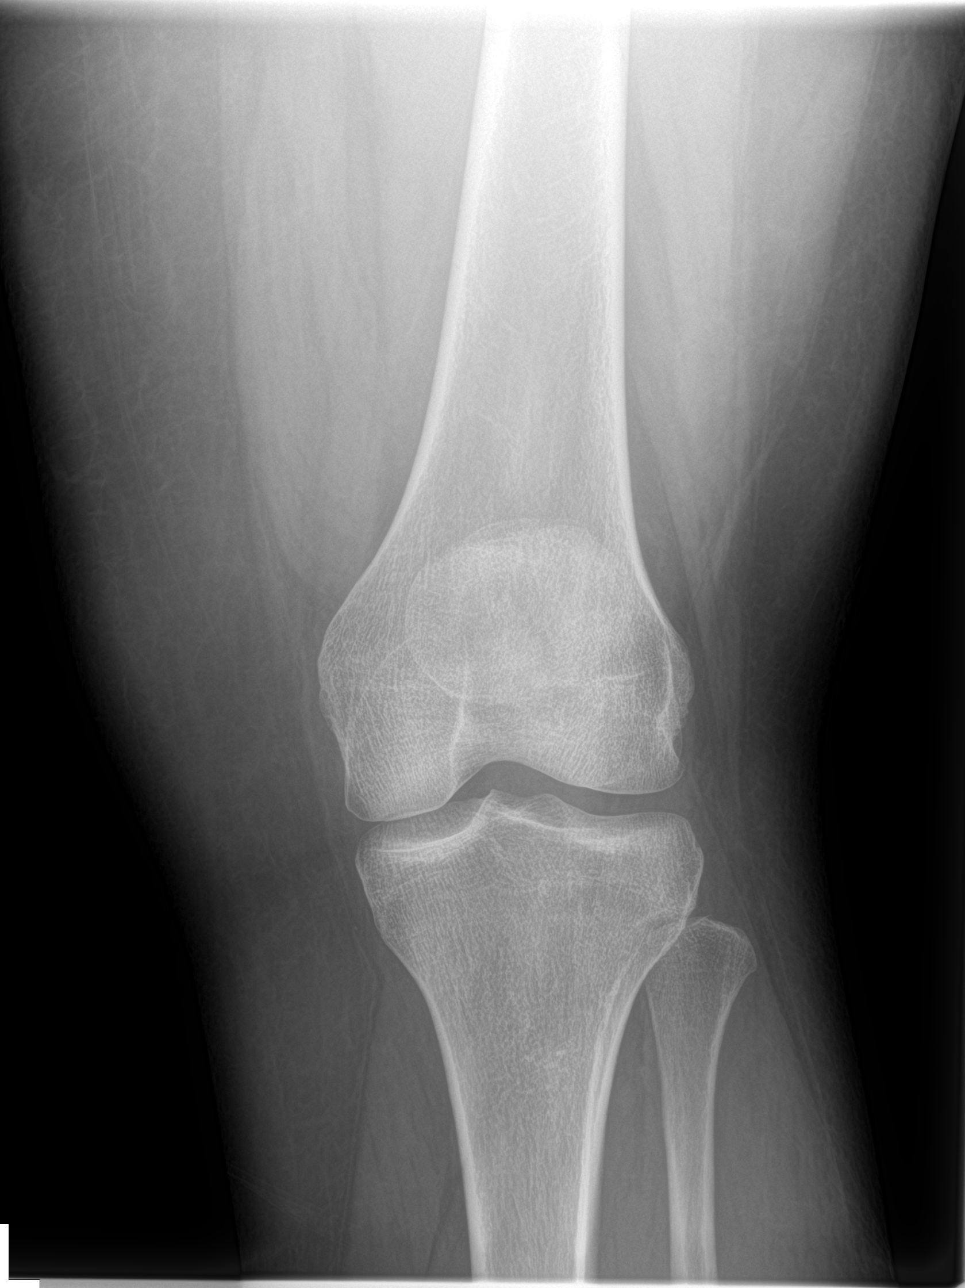

[knee lat]
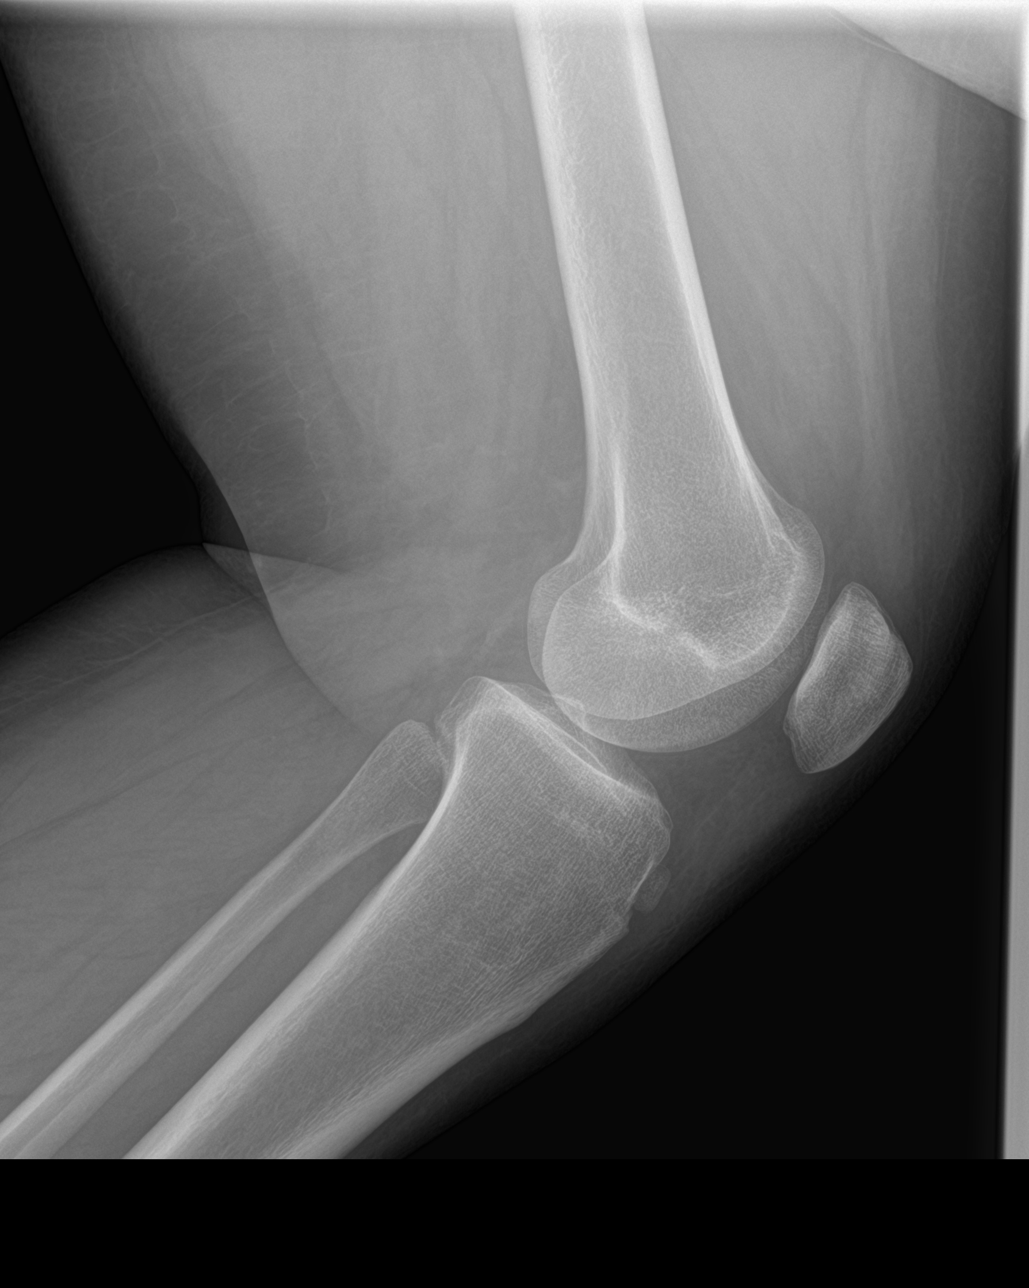

[knee obl (1 of 2)]
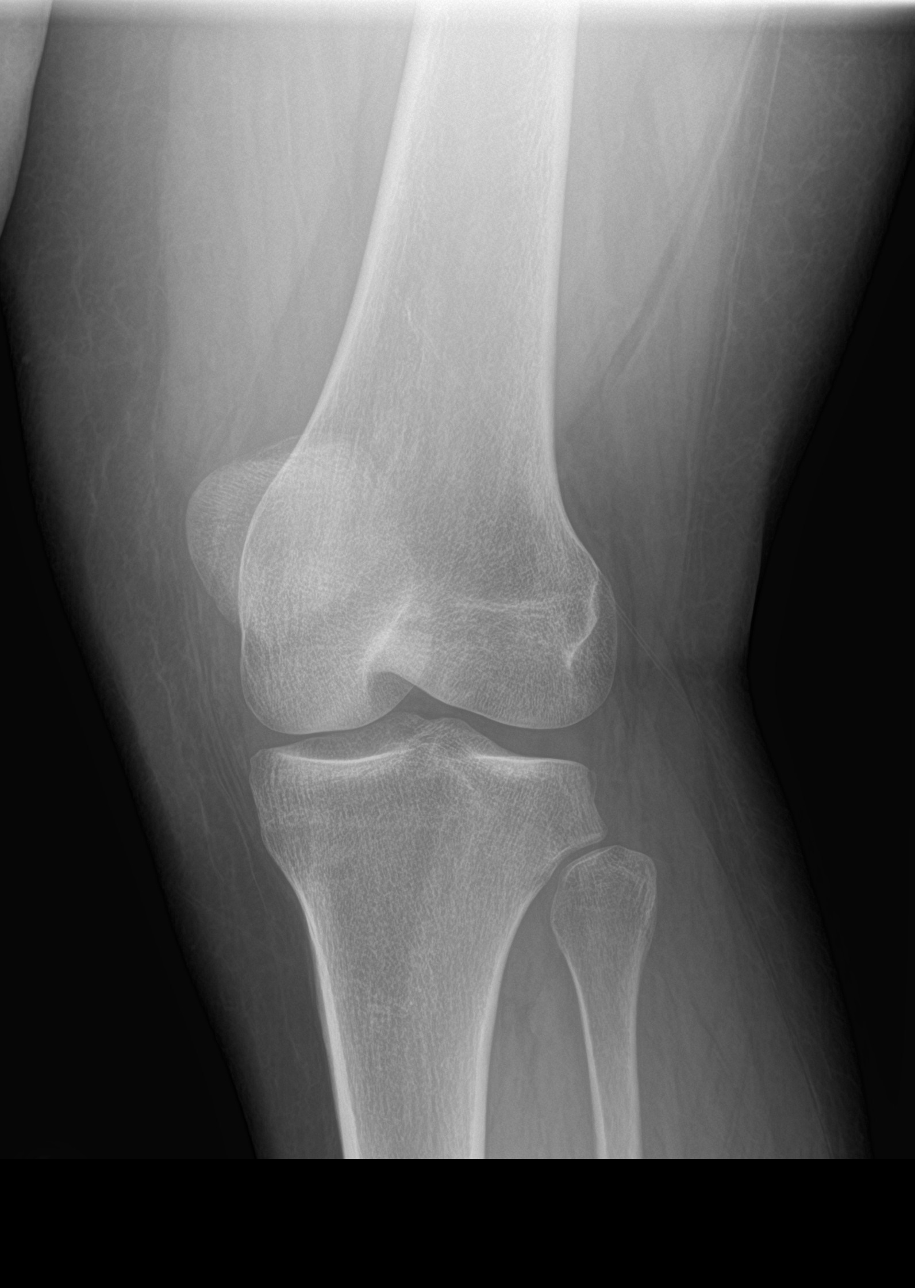

[knee obl (2 of 2)]
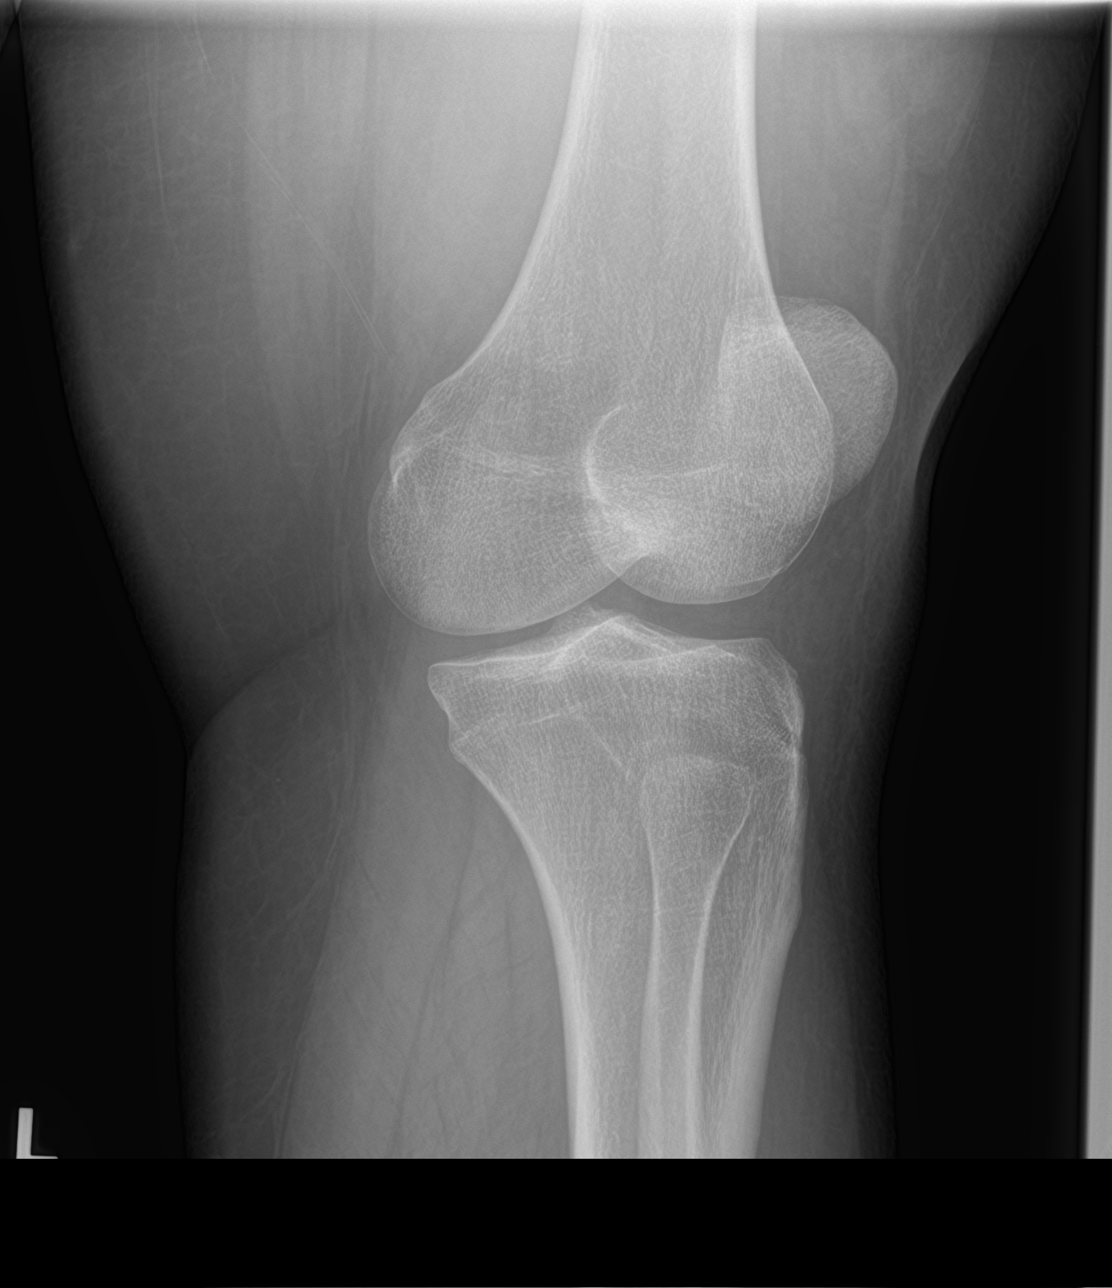

[4 of 4 positions shown; findings below may reference images not displayed]

FINDINGS: No evidence of fracture, dislocation, or joint effusion. No evidence
of arthropathy or other focal bone abnormality. Soft tissues are
unremarkable.
IMPRESSION: Negative.

## 2020-02-10 IMAGING — CR PELVIS - 1-2 VIEW
2 series · 2 of 2 positions shown · non-contrast
Comparison: None.

CLINICAL DATA: Pelvic pain status post fall

EXAM:
PELVIS - 1-2 VIEW

[pelvis ap (1 of 2)]
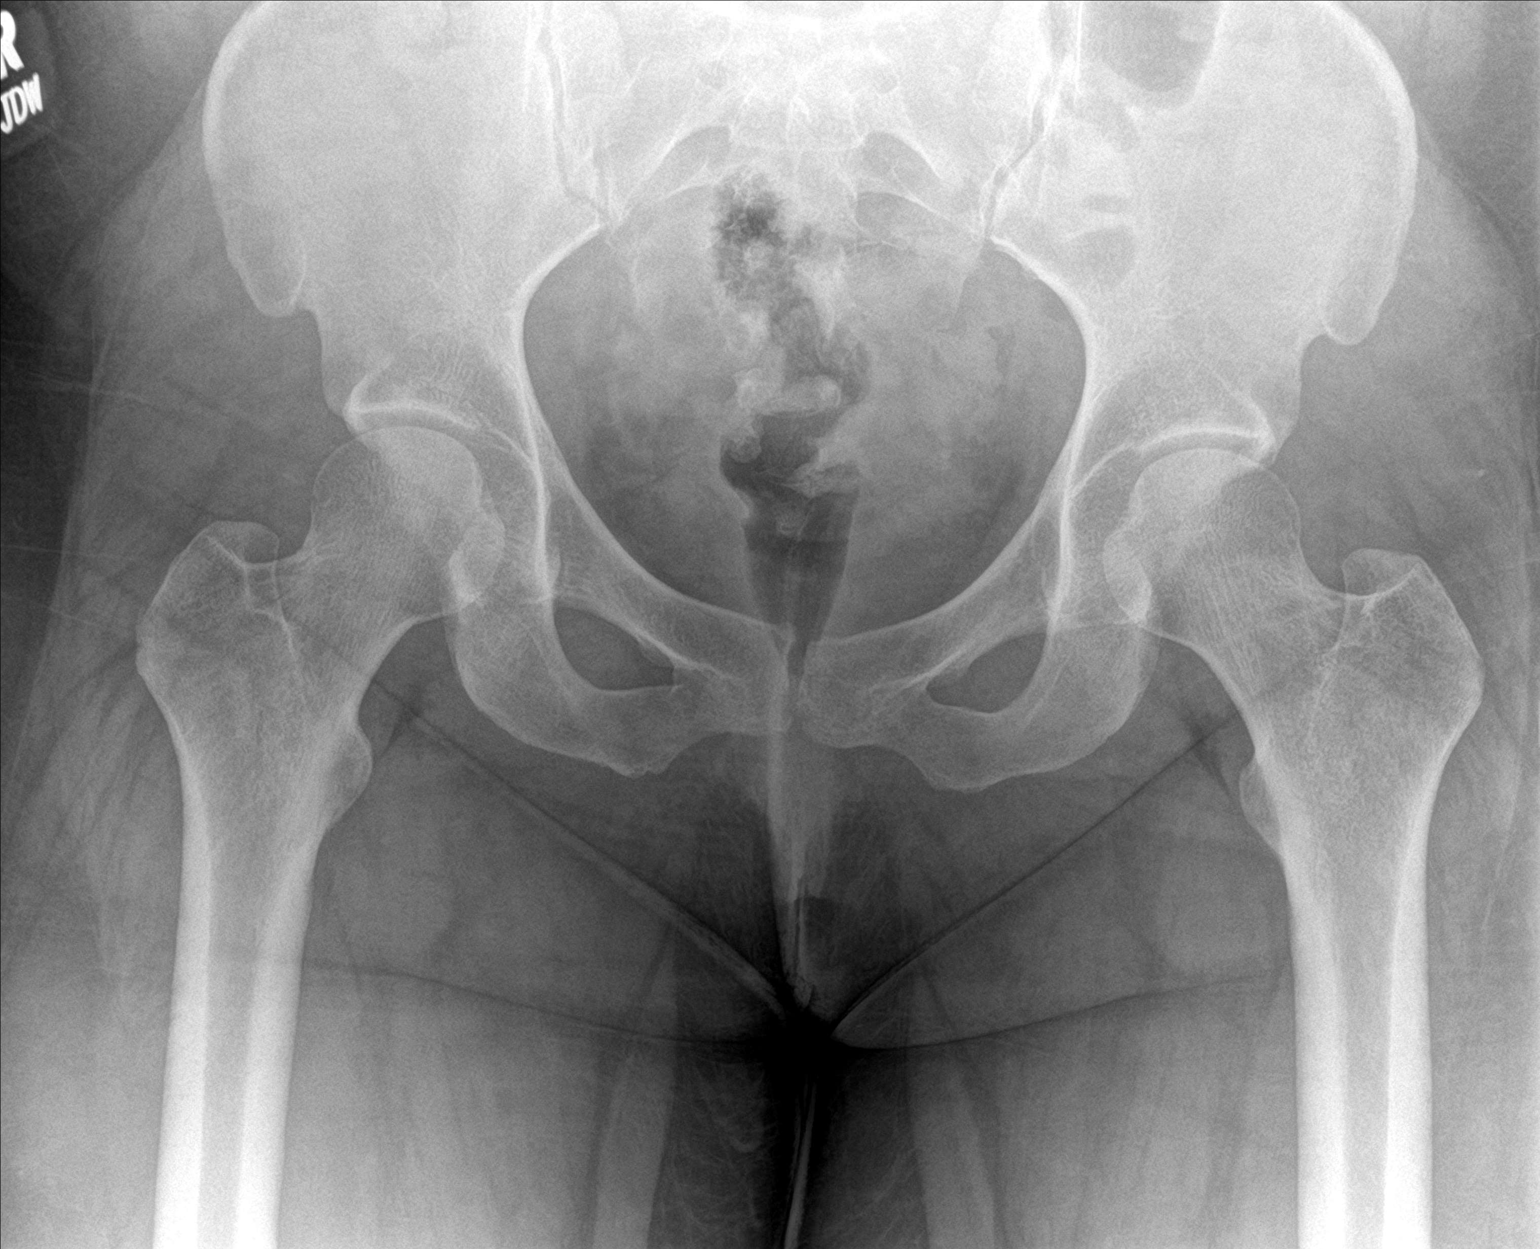

[pelvis ap (2 of 2)]
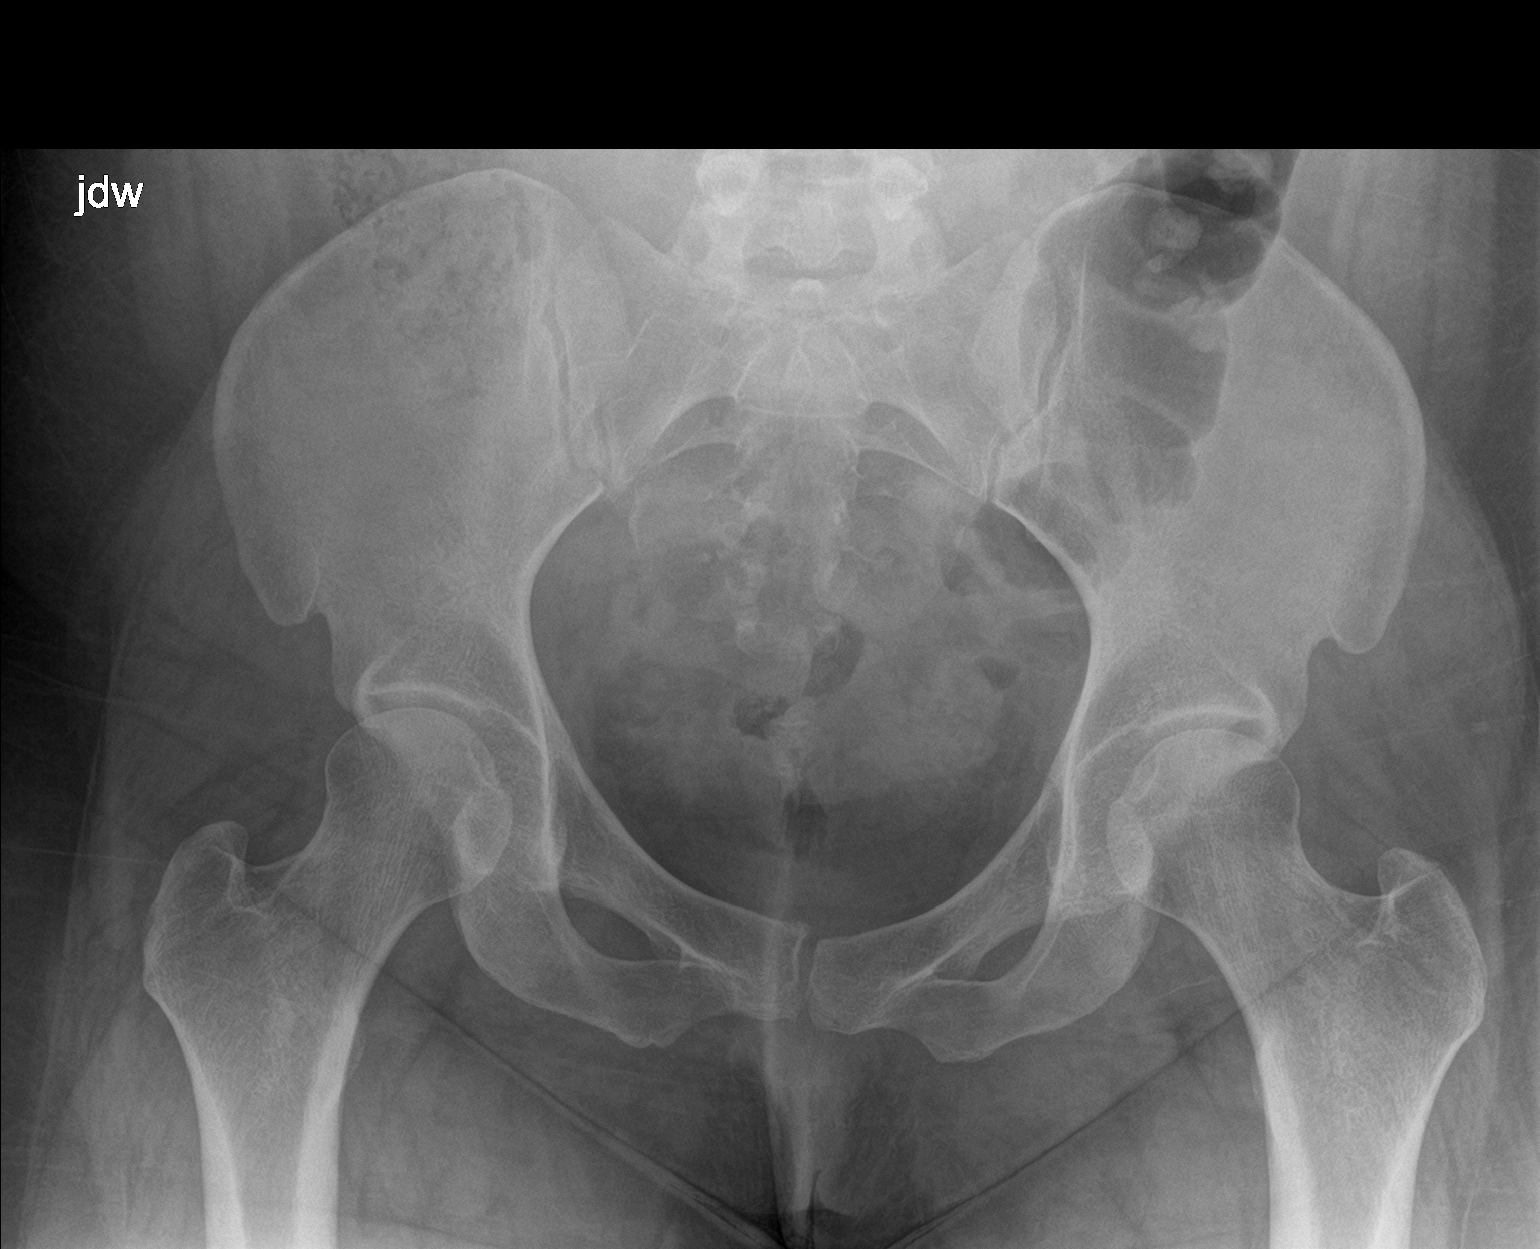

[2 of 2 positions shown; findings below may reference images not displayed]

FINDINGS: There is no evidence of pelvic fracture or diastasis. No pelvic bone
lesions are seen.
IMPRESSION: Negative.

## 2020-02-26 ENCOUNTER — Inpatient Hospital Stay (HOSPITAL_COMMUNITY): Payer: Medicare Other

## 2020-02-26 ENCOUNTER — Encounter (HOSPITAL_COMMUNITY): Payer: Self-pay | Admitting: Family Medicine

## 2020-02-26 ENCOUNTER — Inpatient Hospital Stay (HOSPITAL_COMMUNITY)
Admission: AD | Admit: 2020-02-26 | Discharge: 2020-02-26 | Disposition: A | Discharge status: Home / Self Care | Payer: Medicare Other | Attending: Family Medicine | Admitting: Family Medicine

## 2020-02-26 ENCOUNTER — Other Ambulatory Visit: Payer: Self-pay

## 2020-02-26 DIAGNOSIS — O99331 Smoking (tobacco) complicating pregnancy, first trimester: Secondary | ICD-10-CM | POA: Insufficient documentation

## 2020-02-26 DIAGNOSIS — N390 Urinary tract infection, site not specified: Secondary | ICD-10-CM | POA: Insufficient documentation

## 2020-02-26 DIAGNOSIS — O219 Vomiting of pregnancy, unspecified: Secondary | ICD-10-CM | POA: Insufficient documentation

## 2020-02-26 DIAGNOSIS — B9689 Other specified bacterial agents as the cause of diseases classified elsewhere: Secondary | ICD-10-CM | POA: Diagnosis not present

## 2020-02-26 DIAGNOSIS — O26891 Other specified pregnancy related conditions, first trimester: Secondary | ICD-10-CM | POA: Insufficient documentation

## 2020-02-26 DIAGNOSIS — D573 Sickle-cell trait: Secondary | ICD-10-CM | POA: Insufficient documentation

## 2020-02-26 DIAGNOSIS — N760 Acute vaginitis: Secondary | ICD-10-CM

## 2020-02-26 DIAGNOSIS — O23591 Infection of other part of genital tract in pregnancy, first trimester: Secondary | ICD-10-CM | POA: Diagnosis not present

## 2020-02-26 DIAGNOSIS — Z3A12 12 weeks gestation of pregnancy: Secondary | ICD-10-CM | POA: Diagnosis not present

## 2020-02-26 DIAGNOSIS — O99611 Diseases of the digestive system complicating pregnancy, first trimester: Secondary | ICD-10-CM | POA: Diagnosis not present

## 2020-02-26 DIAGNOSIS — Z3491 Encounter for supervision of normal pregnancy, unspecified, first trimester: Secondary | ICD-10-CM

## 2020-02-26 DIAGNOSIS — R109 Unspecified abdominal pain: Secondary | ICD-10-CM | POA: Insufficient documentation

## 2020-02-26 DIAGNOSIS — O99891 Other specified diseases and conditions complicating pregnancy: Secondary | ICD-10-CM

## 2020-02-26 DIAGNOSIS — K5900 Constipation, unspecified: Secondary | ICD-10-CM | POA: Insufficient documentation

## 2020-02-26 DIAGNOSIS — O2341 Unspecified infection of urinary tract in pregnancy, first trimester: Secondary | ICD-10-CM | POA: Insufficient documentation

## 2020-02-26 DIAGNOSIS — F17210 Nicotine dependence, cigarettes, uncomplicated: Secondary | ICD-10-CM | POA: Diagnosis not present

## 2020-02-26 DIAGNOSIS — Z3A08 8 weeks gestation of pregnancy: Secondary | ICD-10-CM

## 2020-02-26 LAB — CBC WITH DIFFERENTIAL/PLATELET
Abs Immature Granulocytes: 0.02 10*3/uL (ref 0.00–0.07)
Basophils Absolute: 0 10*3/uL (ref 0.0–0.1)
Basophils Relative: 0 %
Eosinophils Absolute: 0 10*3/uL (ref 0.0–0.5)
Eosinophils Relative: 1 %
HCT: 32.8 % — ABNORMAL LOW (ref 36.0–46.0)
Hemoglobin: 11.5 g/dL — ABNORMAL LOW (ref 12.0–15.0)
Immature Granulocytes: 0 %
Lymphocytes Relative: 39 %
Lymphs Abs: 2.6 10*3/uL (ref 0.7–4.0)
MCH: 28.5 pg (ref 26.0–34.0)
MCHC: 35.1 g/dL (ref 30.0–36.0)
MCV: 81.2 fL (ref 80.0–100.0)
Monocytes Absolute: 0.6 10*3/uL (ref 0.1–1.0)
Monocytes Relative: 9 %
Neutro Abs: 3.4 10*3/uL (ref 1.7–7.7)
Neutrophils Relative %: 51 %
Platelets: 313 10*3/uL (ref 150–400)
RBC: 4.04 MIL/uL (ref 3.87–5.11)
RDW: 12.7 % (ref 11.5–15.5)
WBC: 6.6 10*3/uL (ref 4.0–10.5)
nRBC: 0 % (ref 0.0–0.2)

## 2020-02-26 LAB — URINALYSIS, ROUTINE W REFLEX MICROSCOPIC
Bilirubin Urine: NEGATIVE
Glucose, UA: NEGATIVE mg/dL
Hgb urine dipstick: NEGATIVE
Ketones, ur: NEGATIVE mg/dL
Leukocytes,Ua: NEGATIVE
Nitrite: POSITIVE — AB
Protein, ur: NEGATIVE mg/dL
Specific Gravity, Urine: 1.021 (ref 1.005–1.030)
pH: 7 (ref 5.0–8.0)

## 2020-02-26 LAB — WET PREP, GENITAL
Sperm: NONE SEEN
Trich, Wet Prep: NONE SEEN
Yeast Wet Prep HPF POC: NONE SEEN

## 2020-02-26 LAB — POCT PREGNANCY, URINE: Preg Test, Ur: POSITIVE — AB

## 2020-02-26 MED ORDER — CEFADROXIL 500 MG PO CAPS: 500.0000 mg | ORAL_CAPSULE | Freq: Two times a day (BID) | ORAL | 0 refills | Status: AC

## 2020-02-26 MED ORDER — METOCLOPRAMIDE HCL 10 MG PO TABS: 10.0000 mg | ORAL_TABLET | Freq: Three times a day (TID) | ORAL | 0 refills | Status: AC | PRN

## 2020-02-26 MED ORDER — METOCLOPRAMIDE HCL 10 MG PO TABS
10.0000 mg | ORAL_TABLET | Freq: Once | ORAL | Status: AC
  Administered 2020-02-26: 10 mg via ORAL
  Filled 2020-02-26: qty 1

## 2020-02-26 MED ORDER — METRONIDAZOLE 0.75 % VA GEL: 1.0000 | Freq: Two times a day (BID) | VAGINAL | 0 refills | Status: DC

## 2020-02-26 NOTE — MAU Provider Note (Signed)
History     CSN: [id number]  Arrival date and time: 02/26/20 1057   First Provider Initiated Contact with Patient 02/26/20 1153      Chief Complaint  Patient presents with  . Abdominal Pain  . Emesis   Samantha Bradford is a 34 y.o. [id number] at [redacted]w[redacted]d who presents for abdominal pressure and nausea. Symptoms started a week ago. Reports lower abdominal pressure that is worse in right side. Pain is worse with intercourse. Also has constipation. Last BM was 2 days ago and was hard/small. Is not treating constipation. Denies fever/chills, dysuria, vaginal bleeding, or vaginal discharge.  Has been having n/v worse for the last week. Only able to keep down fluids. Has not treated symptoms.  Has first OB appointment in [place] tomorrow.   Location: abdomen Quality: pressure Severity: currently 0/10 on pain scale Duration: 1 week Timing: intermittent Modifying factors: worse with intercourse Associated signs and symptoms: nausea, constipation    OB History    Gravida  5   Para  3   Term  3   Preterm      AB  1   Living  3     SAB      TAB  1   Ectopic      Multiple      Live Births  1           Past Medical History:  Diagnosis Date  . Depression   . GERD (gastroesophageal reflux disease)   . Sickle cell trait (HCC)     History reviewed. No pertinent surgical history.  Family History  Problem Relation Age of Onset  . Asthma Mother   . Hypertension Mother   . Asthma Brother   . Asthma Son     Social History   Tobacco Use  . Smoking status: Current Every Day Smoker    Types: Cigarettes  . Smokeless tobacco: Never Used  Vaping Use  . Vaping Use: Never used  Substance Use Topics  . Alcohol use: No  . Drug use: Not Currently    Types: Marijuana    Comment: last used 2 days ago    Allergies: No Known Allergies  No medications prior to admission.    Review of Systems  Constitutional: Negative.   Gastrointestinal: Positive for abdominal  pain, constipation, nausea and vomiting. Negative for diarrhea.  Genitourinary: Negative.    Physical Exam   Blood pressure 127/66, pulse 61, temperature 98.6 F (37 C), temperature source Oral, resp. rate 18, height 5\' 7"  (1.702 m), weight 102.4 kg, last menstrual period 11/28/2019, SpO2 100 %, unknown if currently breastfeeding.  Physical Exam Vitals and nursing note reviewed. Exam conducted with a chaperone present.  Constitutional:      General: She is not in acute distress.    Appearance: She is well-developed.  Pulmonary:     Effort: Pulmonary effort is normal. No respiratory distress.  Abdominal:     Palpations: Abdomen is soft.     Tenderness: There is abdominal tenderness in the right lower quadrant. There is no guarding or rebound. Negative signs include McBurney's sign.  Genitourinary:    General: Normal vulva.     Vagina: Tenderness present.     Cervix: Discharge (foul smelling discharge) present. No cervical motion tenderness.  Neurological:     Mental Status: She is alert.     MAU Course  Procedures Results for orders placed or performed during the hospital encounter of 02/26/20 (from the past 24 hour(s))  Urinalysis, Routine  w reflex microscopic     Status: Abnormal   Collection Time: 02/26/20 11:23 AM  Result Value Ref Range   Color, Urine AMBER (A) YELLOW   APPearance CLOUDY (A) CLEAR   Specific Gravity, Urine 1.021 1.005 - 1.030   pH 7.0 5.0 - 8.0   Glucose, UA NEGATIVE NEGATIVE mg/dL   Hgb urine dipstick NEGATIVE NEGATIVE   Bilirubin Urine NEGATIVE NEGATIVE   Ketones, ur NEGATIVE NEGATIVE mg/dL   Protein, ur NEGATIVE NEGATIVE mg/dL   Nitrite POSITIVE (A) NEGATIVE   Leukocytes,Ua NEGATIVE NEGATIVE   RBC / HPF 0-5 0 - 5 RBC/hpf   WBC, UA 6-10 0 - 5 WBC/hpf   Bacteria, UA RARE (A) NONE SEEN   Squamous Epithelial / LPF 21-50 0 - 5   Mucus PRESENT   Pregnancy, urine POC     Status: Abnormal   Collection Time: 02/26/20 11:32 AM  Result Value Ref Range    Preg Test, Ur POSITIVE (A) NEGATIVE  CBC with Differential/Platelet     Status: Abnormal   Collection Time: 02/26/20 12:09 PM  Result Value Ref Range   WBC 6.6 4.0 - 10.5 K/uL   RBC 4.04 3.87 - 5.11 MIL/uL   Hemoglobin 11.5 (L) 12.0 - 15.0 g/dL   HCT 40.3 (L) 36 - 46 %   MCV 81.2 80.0 - 100.0 fL   MCH 28.5 26.0 - 34.0 pg   MCHC 35.1 30.0 - 36.0 g/dL   RDW 47.4 25.9 - 56.3 %   Platelets 313 150 - 400 K/uL   nRBC 0.0 0.0 - 0.2 %   Neutrophils Relative % 51 %   Neutro Abs 3.4 1.7 - 7.7 K/uL   Lymphocytes Relative 39 %   Lymphs Abs 2.6 0.7 - 4.0 K/uL   Monocytes Relative 9 %   Monocytes Absolute 0.6 0 - 1 K/uL   Eosinophils Relative 1 %   Eosinophils Absolute 0.0 0 - 0 K/uL   Basophils Relative 0 %   Basophils Absolute 0.0 0 - 0 K/uL   Immature Granulocytes 0 %   Abs Immature Granulocytes 0.02 0.00 - 0.07 K/uL  Wet prep, genital     Status: Abnormal   Collection Time: 02/26/20 12:30 PM   Specimen: Vaginal  Result Value Ref Range   Yeast Wet Prep HPF POC NONE SEEN NONE SEEN   Trich, Wet Prep NONE SEEN NONE SEEN   Clue Cells Wet Prep HPF POC PRESENT (A) NONE SEEN   WBC, Wet Prep HPF POC MANY (A) NONE SEEN   Sperm NONE SEEN    US OB Comp Less 14 Wks  Result Date: 02/26/2020 CLINICAL DATA:  Pelvic pain EXAM: OBSTETRIC <14 WK ULTRASOUND TECHNIQUE: Transabdominal ultrasound was performed for evaluation of the gestation as well as the maternal uterus and adnexal regions. COMPARISON:  None. FINDINGS: Intrauterine gestational sac: Visualized-single Yolk sac:  Visualize Embryo:  Visualized Cardiac Activity: Visualized Heart Rate: 161 bpm CRL:   20 mm   8 w 4 d                  [place] EDC: Oct 03, 2020 Subchorionic hemorrhage:  None visualized. Maternal uterus/adnexae: Cervical os is closed. Right ovary measures 2.5 x 1.8 x 1.7 cm. Left ovary measures 3.0 x 2.0 x 1.8 cm. No extrauterine pelvic mass or free pelvic fluid. IMPRESSION: Single live intrauterine gestation with estimated gestational  age of 8+ weeks. No subchorionic hemorrhage evident. No extrauterine pelvic mass or fluid. Electronically Signed   By:  Bretta Bang III M.D.   On: 02/26/2020 13:23    MDM +UPT UA, wet prep, GC/chlamydia, CBC, ABO/Rh, quant hCG, and [place] today to rule out ectopic pregnancy which can be life threatening.   Ultrasound shows live IUP measuring [redacted]w[redacted]d; EDD updated  Wet prep & GC/CT collected. Wet prep positive for clue cells; will treat for BV based on exam.   U/a with positive nitrites. Afebrile & no CVA tenderness. Will tx with duricef & send urine for culture.   Patient tearful in MAU due to stressors, recent death of mother, and depression. Agreeable to speak with chaplain while here but requesting to be discharged home prior to chaplain's arrival. She had PCP & therapist in [place].    Assessment and Plan   1. Normal IUP (intrauterine pregnancy) on prenatal ultrasound, first trimester   2. Abdominal pain during pregnancy in first trimester  -pain likely due to UTI vs constipation. Reviewed SAB precautions  3. Nausea and vomiting during pregnancy prior to [redacted] weeks gestation  -Rx reglan  4. Constipation during pregnancy in first trimester  -given written instructions regarding treatment of constipation  5. [redacted] weeks gestation of pregnancy   6. Urinary tract infection in mother during first trimester of pregnancy  -Rx duricef -urine culture pending  7. Bacterial vaginosis  Rx metrogel     Judeth Horn 02/26/2020, 4:28 PM

## 2020-02-26 NOTE — MAU Note (Signed)
Having so much pressure in lower abd,- for the past wk. Not pain.  Can't keep nothing on her stomach, only able to drink, been over a wk.  Is losing weight.  During sex, partner says he can feel something. No bleeding. No care yet, tomorrow is first appt.

## 2020-02-26 NOTE — Discharge Instructions (Signed)
Constipation, Adult Constipation is when a person has fewer bowel movements in a week than normal, has difficulty having a bowel movement, or has stools that are dry, hard, or larger than normal. Constipation may be caused by an underlying condition. It may become worse with age if a person takes certain medicines and does not take in enough fluids. Follow these instructions at home: Eating and drinking   Eat foods that have a lot of fiber, such as fresh fruits and vegetables, whole grains, and beans.  Limit foods that are high in fat, low in fiber, or overly processed, such as french fries, hamburgers, cookies, candies, and soda.  Drink enough fluid to keep your urine clear or pale yellow. General instructions  Exercise regularly or as told by your health care provider.  Go to the restroom when you have the urge to go. Do not hold it in.  Take over-the-counter and prescription medicines only as told by your health care provider. These include any fiber supplements.  Practice pelvic floor retraining exercises, such as deep breathing while relaxing the lower abdomen and pelvic floor relaxation during bowel movements.  Watch your condition for any changes.  Keep all follow-up visits as told by your health care provider. This is important. Contact a health care provider if:  You have pain that gets worse.  You have a fever.  You do not have a bowel movement after 4 days.  You vomit.  You are not hungry.  You lose weight.  You are bleeding from the anus.  You have thin, pencil-like stools. Get help right away if:  You have a fever and your symptoms suddenly get worse.  You leak stool or have blood in your stool.  Your abdomen is bloated.  You have severe pain in your abdomen.  You feel dizzy or you faint. This information is not intended to replace advice given to you by your health care provider. Make sure you discuss any questions you have with your health care  provider. Document Revised: 04/23/2017 Document Reviewed: 10/30/2015 Elsevier Patient Education  2020 Elsevier Inc.     Pregnancy and Urinary Tract Infection  A urinary tract infection (UTI) is an infection of any part of the urinary tract. This includes the kidneys, the tubes that connect your kidneys to your bladder (ureters), the bladder, and the tube that carries urine out of your body (urethra). These organs make, store, and get rid of urine in the body. Your health care provider may use other names to describe the infection. An upper UTI affects the ureters and kidneys (pyelonephritis). A lower UTI affects the bladder (cystitis) and urethra (urethritis). Most urinary tract infections are caused by bacteria in your genital area, around the entrance to your urinary tract (urethra). These bacteria grow and cause irritation and inflammation of your urinary tract. You are more likely to develop a UTI during pregnancy because the physical and hormonal changes your body goes through can make it easier for bacteria to get into your urinary tract. Your growing baby also puts pressure on your bladder and can affect urine flow. It is important to recognize and treat UTIs in pregnancy because of the risk of serious complications for both you and your baby. How does this affect me? Symptoms of a UTI include:  Needing to urinate right away (urgently).  Frequent urination or passing small amounts of urine frequently.  Pain or burning with urination.  Blood in the urine.  Urine that smells bad or unusual.  Trouble urinating.  Cloudy urine.  Pain in the abdomen or lower back.  Vaginal discharge. You may also have:  Vomiting or a decreased appetite.  Confusion.  Irritability or tiredness.  A fever.  Diarrhea. How does this affect my baby? An untreated UTI during pregnancy could lead to a kidney infection or a systemic infection, which can cause health problems that could affect your  baby. Possible complications of an untreated UTI include:  Giving birth to your baby before 37 weeks of pregnancy (premature).  Having a baby with a low birth weight.  Developing high blood pressure during pregnancy (preeclampsia).  Having a low hemoglobin level (anemia). What can I do to lower my risk? To prevent a UTI:  Go to the bathroom as soon as you feel the need. Do not hold urine for long periods of time.  Always wipe from front to back, especially after a bowel movement. Use each tissue one time when you wipe.  Empty your bladder after sex.  Keep your genital area dry.  Drink 6-10 glasses of water each day.  Do not douche or use deodorant sprays. How is this treated? Treatment for this condition may include:  Antibiotic medicines that are safe to take during pregnancy.  Other medicines to treat less common causes of UTI. Follow these instructions at home:  If you were prescribed an antibiotic medicine, take it as told by your health care provider. Do not stop using the antibiotic even if you start to feel better.  Keep all follow-up visits as told by your health care provider. This is important. Contact a health care provider if:  Your symptoms do not improve or they get worse.  You have abnormal vaginal discharge. Get help right away if you:  Have a fever.  Have nausea and vomiting.  Have back or side pain.  Feel contractions in your uterus.  Have lower belly pain.  Have a gush of fluid from your vagina.  Have blood in your urine. Summary  A urinary tract infection (UTI) is an infection of any part of the urinary tract, which includes the kidneys, ureters, bladder, and urethra.  Most urinary tract infections are caused by bacteria in your genital area, around the entrance to your urinary tract (urethra).  You are more likely to develop a UTI during pregnancy.  If you were prescribed an antibiotic medicine, take it as told by your health care  provider. Do not stop using the antibiotic even if you start to feel better. This information is not intended to replace advice given to you by your health care provider. Make sure you discuss any questions you have with your health care provider. Document Revised: 09/02/2018 Document Reviewed: 04/14/2018 Elsevier Patient Education  2020 Elsevier Inc.      Bacterial Vaginosis  Bacterial vaginosis is a vaginal infection that occurs when the normal balance of bacteria in the vagina is disrupted. It results from an overgrowth of certain bacteria. This is the most common vaginal infection among women ages 53-44. Because bacterial vaginosis increases your risk for STIs (sexually transmitted infections), getting treated can help reduce your risk for chlamydia, gonorrhea, herpes, and HIV (human immunodeficiency virus). Treatment is also important for preventing complications in pregnant women, because this condition can cause an early (premature) delivery. What are the causes? This condition is caused by an increase in harmful bacteria that are normally present in small amounts in the vagina. However, the reason that the condition develops is not fully understood. What  increases the risk? The following factors may make you more likely to develop this condition:  Having a new sexual partner or multiple sexual partners.  Having unprotected sex.  Douching.  Having an intrauterine device (IUD).  Smoking.  Drug and alcohol abuse.  Taking certain antibiotic medicines.  Being pregnant. You cannot get bacterial vaginosis from toilet seats, bedding, swimming pools, or contact with objects around you. What are the signs or symptoms? Symptoms of this condition include:  Grey or white vaginal discharge. The discharge can also be watery or foamy.  A fish-like odor with discharge, especially after sexual intercourse or during menstruation.  Itching in and around the vagina.  Burning or pain  with urination. Some women with bacterial vaginosis have no signs or symptoms. How is this diagnosed? This condition is diagnosed based on:  Your medical history.  A physical exam of the vagina.  Testing a sample of vaginal fluid under a microscope to look for a large amount of bad bacteria or abnormal cells. Your health care provider may use a cotton swab or a small wooden spatula to collect the sample. How is this treated? This condition is treated with antibiotics. These may be given as a pill, a vaginal cream, or a medicine that is put into the vagina (suppository). If the condition comes back after treatment, a second round of antibiotics may be needed. Follow these instructions at home: Medicines  Take over-the-counter and prescription medicines only as told by your health care provider.  Take or use your antibiotic as told by your health care provider. Do not stop taking or using the antibiotic even if you start to feel better. General instructions  If you have a female sexual partner, tell her that you have a vaginal infection. She should see her health care provider and be treated if she has symptoms. If you have a female sexual partner, he does not need treatment.  During treatment: ? Avoid sexual activity until you finish treatment. ? Do not douche. ? Avoid alcohol as directed by your health care provider. ? Avoid breastfeeding as directed by your health care provider.  Drink enough water and fluids to keep your urine clear or pale yellow.  Keep the area around your vagina and rectum clean. ? Wash the area daily with warm water. ? Wipe yourself from front to back after using the toilet.  Keep all follow-up visits as told by your health care provider. This is important. How is this prevented?  Do not douche.  Wash the outside of your vagina with warm water only.  Use protection when having sex. This includes latex condoms and dental dams.  Limit how many sexual  partners you have. To help prevent bacterial vaginosis, it is best to have sex with just one partner (monogamous).  Make sure you and your sexual partner are tested for STIs.  Wear cotton or cotton-lined underwear.  Avoid wearing tight pants and pantyhose, especially during summer.  Limit the amount of alcohol that you drink.  Do not use any products that contain nicotine or tobacco, such as cigarettes and e-cigarettes. If you need help quitting, ask your health care provider.  Do not use illegal drugs. Where to find more information  Centers for Disease Control and Prevention: SolutionApps.co.za  American Sexual Health Association (ASHA): www.ashastd.org  U.S[place] Department of Health and Health and safety inspector, Office on Women's Health: ConventionalMedicines.si or http://www.anderson-williamson.info/ Contact a health care provider if:  Your symptoms do not improve, even after treatment.  You  have more discharge or pain when urinating.  You have a fever.  You have pain in your abdomen.  You have pain during sex.  You have vaginal bleeding between periods. Summary  Bacterial vaginosis is a vaginal infection that occurs when the normal balance of bacteria in the vagina is disrupted.  Because bacterial vaginosis increases your risk for STIs (sexually transmitted infections), getting treated can help reduce your risk for chlamydia, gonorrhea, herpes, and HIV (human immunodeficiency virus). Treatment is also important for preventing complications in pregnant women, because the condition can cause an early (premature) delivery.  This condition is treated with antibiotic medicines. These may be given as a pill, a vaginal cream, or a medicine that is put into the vagina (suppository). This information is not intended to replace advice given to you by your health care provider. Make sure you discuss any questions you have with your health care provider. Document Revised:  04/23/2017 Document Reviewed: 01/25/2016 Elsevier Patient Education  2020 [employer].

## 2020-02-27 LAB — GC/CHLAMYDIA PROBE AMP (~~LOC~~) NOT AT ARMC
Chlamydia: NEGATIVE
Comment: NEGATIVE
Comment: NORMAL
Neisseria Gonorrhea: NEGATIVE

## 2020-02-28 LAB — CULTURE, OB URINE: Culture: >=100000 — AB

## 2020-09-07 ENCOUNTER — Inpatient Hospital Stay (HOSPITAL_COMMUNITY): Payer: Medicare Other | Admitting: Anesthesiology

## 2020-09-07 ENCOUNTER — Other Ambulatory Visit: Payer: Self-pay

## 2020-09-07 ENCOUNTER — Encounter (HOSPITAL_COMMUNITY): Payer: Self-pay | Admitting: Obstetrics & Gynecology

## 2020-09-07 ENCOUNTER — Observation Stay (HOSPITAL_COMMUNITY)
Admission: AD | Admit: 2020-09-07 | Discharge: 2020-09-08 | Disposition: A | Discharge status: Home / Self Care | Payer: Medicare Other | Attending: Obstetrics & Gynecology | Admitting: Obstetrics & Gynecology

## 2020-09-07 DIAGNOSIS — Z3A36 36 weeks gestation of pregnancy: Secondary | ICD-10-CM | POA: Insufficient documentation

## 2020-09-07 DIAGNOSIS — O479 False labor, unspecified: Secondary | ICD-10-CM | POA: Diagnosis present

## 2020-09-07 DIAGNOSIS — O4703 False labor before 37 completed weeks of gestation, third trimester: Principal | ICD-10-CM | POA: Insufficient documentation

## 2020-09-07 DIAGNOSIS — Z20822 Contact with and (suspected) exposure to covid-19: Secondary | ICD-10-CM | POA: Diagnosis not present

## 2020-09-07 LAB — HEPATITIS B SURFACE ANTIGEN: Hepatitis B Surface Ag: NONREACTIVE

## 2020-09-07 LAB — CBC
HCT: 33.5 % — ABNORMAL LOW (ref 36.0–46.0)
Hemoglobin: 11.7 g/dL — ABNORMAL LOW (ref 12.0–15.0)
MCH: 29.7 pg (ref 26.0–34.0)
MCHC: 34.9 g/dL (ref 30.0–36.0)
MCV: 85 fL (ref 80.0–100.0)
Platelets: 270 10*3/uL (ref 150–400)
RBC: 3.94 MIL/uL (ref 3.87–5.11)
RDW: 13 % (ref 11.5–15.5)
WBC: 9 10*3/uL (ref 4.0–10.5)
nRBC: 0 % (ref 0.0–0.2)

## 2020-09-07 LAB — RESP PANEL BY RT-PCR (FLU A&B, COVID) ARPGX2
Influenza A by PCR: NEGATIVE
Influenza B by PCR: NEGATIVE
SARS Coronavirus 2 by RT PCR: NEGATIVE

## 2020-09-07 LAB — RAPID HIV SCREEN (HIV 1/2 AB+AG)
HIV 1/2 Antibodies: NONREACTIVE
HIV-1 P24 Antigen - HIV24: NONREACTIVE

## 2020-09-07 LAB — HIV ANTIBODY (ROUTINE TESTING W REFLEX): HIV Screen 4th Generation wRfx: NONREACTIVE

## 2020-09-07 LAB — TYPE AND SCREEN
ABO/RH(D): O POS
Antibody Screen: NEGATIVE

## 2020-09-07 LAB — GROUP B STREP BY PCR: Group B strep by PCR: NEGATIVE

## 2020-09-07 MED ORDER — FENTANYL-BUPIVACAINE-NACL 0.5-0.125-0.9 MG/250ML-% EP SOLN
12.0000 mL/h | EPIDURAL | Status: DC | PRN
  Administered 2020-09-07 – 2020-09-08 (×2): 12 mL/h via EPIDURAL
  Filled 2020-09-07 (×2): qty 250

## 2020-09-07 MED ORDER — ACETAMINOPHEN 325 MG PO TABS: 650.0000 mg | ORAL_TABLET | ORAL | Status: DC | PRN

## 2020-09-07 MED ORDER — PHENYLEPHRINE 40 MCG/ML (10ML) SYRINGE FOR IV PUSH (FOR BLOOD PRESSURE SUPPORT): 80.0000 ug | PREFILLED_SYRINGE | INTRAVENOUS | Status: DC | PRN

## 2020-09-07 MED ORDER — EPHEDRINE 5 MG/ML INJ: 10.0000 mg | INTRAVENOUS | Status: DC | PRN

## 2020-09-07 MED ORDER — LACTATED RINGERS IV SOLN: INTRAVENOUS | Status: DC

## 2020-09-07 MED ORDER — PENICILLIN G POT IN DEXTROSE 60000 UNIT/ML IV SOLN
3.0000 10*6.[IU] | INTRAVENOUS | Status: DC
  Administered 2020-09-07 – 2020-09-08 (×4): 3 10*6.[IU] via INTRAVENOUS
  Filled 2020-09-07 (×4): qty 50

## 2020-09-07 MED ORDER — SODIUM CHLORIDE 0.9 % IV SOLN
5.0000 10*6.[IU] | Freq: Once | INTRAVENOUS | Status: AC
  Administered 2020-09-07: 5 10*6.[IU] via INTRAVENOUS
  Filled 2020-09-07: qty 5

## 2020-09-07 MED ORDER — ONDANSETRON HCL 4 MG/2ML IJ SOLN: 4.0000 mg | Freq: Four times a day (QID) | INTRAMUSCULAR | Status: DC | PRN

## 2020-09-07 MED ORDER — LACTATED RINGERS IV SOLN: 500.0000 mL | INTRAVENOUS | Status: DC | PRN

## 2020-09-07 MED ORDER — BETAMETHASONE SOD PHOS & ACET 6 (3-3) MG/ML IJ SUSP
12.0000 mg | Freq: Once | INTRAMUSCULAR | Status: AC
  Administered 2020-09-07: 12 mg via INTRAMUSCULAR
  Filled 2020-09-07: qty 5

## 2020-09-07 MED ORDER — OXYTOCIN BOLUS FROM INFUSION: 333.0000 mL | Freq: Once | INTRAVENOUS | Status: DC

## 2020-09-07 MED ORDER — LIDOCAINE HCL (PF) 1 % IJ SOLN: 30.0000 mL | INTRAMUSCULAR | Status: DC | PRN

## 2020-09-07 MED ORDER — DIPHENHYDRAMINE HCL 50 MG/ML IJ SOLN
12.5000 mg | INTRAMUSCULAR | Status: DC | PRN
Start: 2020-09-07 — End: 2020-09-08
  Administered 2020-09-07: 12.5 mg via INTRAVENOUS
  Filled 2020-09-07: qty 1

## 2020-09-07 MED ORDER — LACTATED RINGERS IV SOLN
500.0000 mL | Freq: Once | INTRAVENOUS | Status: AC
  Administered 2020-09-07: 500 mL via INTRAVENOUS

## 2020-09-07 MED ORDER — OXYCODONE-ACETAMINOPHEN 5-325 MG PO TABS: 1.0000 | ORAL_TABLET | ORAL | Status: DC | PRN

## 2020-09-07 MED ORDER — LIDOCAINE HCL (PF) 1 % IJ SOLN
INTRAMUSCULAR | Status: DC | PRN
  Administered 2020-09-07: 7 mL via EPIDURAL
  Administered 2020-09-07: 3 mL via EPIDURAL

## 2020-09-07 MED ORDER — OXYCODONE-ACETAMINOPHEN 5-325 MG PO TABS: 2.0000 | ORAL_TABLET | ORAL | Status: DC | PRN

## 2020-09-07 MED ORDER — SOD CITRATE-CITRIC ACID 500-334 MG/5ML PO SOLN: 30.0000 mL | ORAL | Status: DC | PRN

## 2020-09-07 MED ORDER — OXYTOCIN-SODIUM CHLORIDE 30-0.9 UT/500ML-% IV SOLN: 2.5000 [IU]/h | INTRAVENOUS | Status: DC

## 2020-09-07 NOTE — Anesthesia Preprocedure Evaluation (Signed)
Anesthesia Evaluation  Patient identified by MRN, date of birth, ID band Patient awake    Reviewed: Allergy & Precautions, NPO status , Patient's Chart, lab work & pertinent test results  Airway Mallampati: II  TM Distance: >3 FB Neck ROM: Full    Dental  (+) Teeth Intact   Pulmonary neg pulmonary ROS, Current Smoker,    Pulmonary exam normal        Cardiovascular negative cardio ROS   Rhythm:Regular Rate:Normal     Neuro/Psych Depression negative neurological ROS     GI/Hepatic Neg liver ROS, GERD  ,  Endo/Other  negative endocrine ROS  Renal/GU negative Renal ROS  negative genitourinary   Musculoskeletal negative musculoskeletal ROS (+)   Abdominal (+)  Abdomen: soft.    Peds  Hematology negative hematology ROS (+)   Anesthesia Other Findings   Reproductive/Obstetrics (+) Pregnancy                             Anesthesia Physical Anesthesia Plan  ASA: II  Anesthesia Plan: Epidural   Post-op Pain Management:    Induction:   PONV Risk Score and Plan: 1 and Treatment may vary due to age or medical condition  Airway Management Planned: Natural Airway  Additional Equipment: None  Intra-op Plan:   Post-operative Plan:   Informed Consent: I have reviewed the patients History and Physical, chart, labs and discussed the procedure including the risks, benefits and alternatives for the proposed anesthesia with the patient or authorized representative who has indicated his/her understanding and acceptance.     Dental advisory given  Plan Discussed with:   Anesthesia Plan Comments: (Lab Results      Component                Value               Date                      WBC                      9.0                 09/07/2020                HGB                      11.7 (L)            09/07/2020                HCT                      33.5 (L)            09/07/2020                 MCV                      85.0                09/07/2020                PLT                      270  09/07/2020          )        Anesthesia Quick Evaluation

## 2020-09-07 NOTE — Anesthesia Procedure Notes (Signed)
Epidural Patient location during procedure: OB Start time: 09/07/2020 1:45 PM End time: 09/07/2020 2:00 PM  Staffing Anesthesiologist: Atilano Median, DO Performed: anesthesiologist   Preanesthetic Checklist Completed: patient identified, IV checked, site marked, risks and benefits discussed, surgical consent, monitors and equipment checked, pre-op evaluation and timeout performed  Epidural Patient position: sitting Prep: ChloraPrep Patient monitoring: heart rate, continuous pulse ox and blood pressure Approach: midline Location: L4-L5 Injection technique: LOR saline  Needle:  Needle type: Tuohy  Needle gauge: 17 G Needle length: 9 cm Needle insertion depth: 9 cm Catheter type: closed end flexible Catheter size: 20 Guage Catheter at skin depth: 14 cm Test dose: negative and 1.5% lidocaine  Assessment Sensory level: T8 Events: blood not aspirated, injection not painful, no injection resistance and no paresthesia  Additional Notes   Patient identified. Risks/Benefits/Options discussed with patient including but not limited to bleeding, infection, nerve damage, paralysis, failed block, incomplete pain control, headache, blood pressure changes, nausea, vomiting, reactions to medications, itching and postpartum back pain. Confirmed with bedside nurse the patient's most recent platelet count. Confirmed with patient that they are not currently taking any anticoagulation, have any bleeding history or any family history of bleeding disorders. Patient expressed understanding and wished to proceed. All questions were answered. Sterile technique was used throughout the entire procedure. Please see nursing notes for vital signs. Test dose was given through epidural catheter and negative prior to continuing to dose epidural or start infusion. Warning signs of high block given to the patient including shortness of breath, tingling/numbness in hands, complete motor block, or any concerning  symptoms with instructions to call for help. Patient was given instructions on fall risk and not to get out of bed. All questions and concerns addressed with instructions to call with any issues or inadequate analgesia.    Reason for block:procedure for pain

## 2020-09-07 NOTE — MAU Provider Note (Signed)
Ms. Samantha Bradford is a [id number] at [redacted]w[redacted]d seen in MAU for labor. RN labor check, seen by provider. Prior SVE by RN was 4.5/thick/ballotable/vtx @ [phone].   SVE by RN Dilation: 5.5 Effacement (%): 50 Cervical Position: Posterior Station: -1 Presentation: Vertex Exam by:: Carloyn Jaeger, CNM   NST - FHR: 135 bpm / moderate variability / accels present / decels absent / TOCO: regular every 4-5 mins   Plan: Admit to L&D Routine admission orders  RN instructed to call labor team to notify them of admission  Raelyn Mora, CNM  09/07/2020 11:27 AM

## 2020-09-07 NOTE — Progress Notes (Signed)
Patient ID: Luv Mish, female   DOB: [birth year], 35 y.o.   MRN: [id number] Patient seen at 2050  Doing well, comfortable with epidural Feels some "balling up"  Vitals:   09/07/20 1910 09/07/20 2000 09/07/20 2030 09/07/20 2100  BP:  123/67 (!) 125/52 120/60  Pulse:  84 65 88  Resp: 18     Temp:  98.5 F (36.9 C)    TempSrc:  Oral    SpO2:      Weight:      Height:       FHR 140s with accels and no decels UCs q4-79min  Dilation: 5 Effacement (%): 60,50 Cervical Position: Posterior Station: Ballotable Presentation: Vertex Exam by:: Kayson Bullis CNM At 2050  Discussed she is probably in Prodromal or latent phase labor No cervical change in 5 hours despite regular UCs every 4 min Discussed we cannot augment her since she is preterm  Will observe overnight and reassess in AM

## 2020-09-07 NOTE — H&P (Signed)
Samantha Bradford is a 35 y.o. female presenting for active labor. OB History    Gravida  5   Para  3   Term  3   Preterm      AB  1   Living  3     SAB      IAB  1   Ectopic      Multiple      Live Births  1          Past Medical History:  Diagnosis Date  . Depression   . GERD (gastroesophageal reflux disease)   . Sickle cell trait (HCC)    History reviewed. No pertinent surgical history. Family History: family history includes Asthma in her brother, mother, and son; Hypertension in her mother. Social History:  reports that she has been smoking cigarettes. She has never used smokeless tobacco. She reports previous drug use. Drug: Marijuana. She reports that she does not drink alcohol.     Maternal Diabetes: unknown, patient reports prenatal care in [place], awaiting records.  Genetic Screening: unknown, patient reports prenatal care in [place], awaiting records.  Maternal Ultrasounds/Referrals: Other: unknown, patient reports prenatal care in [place], awaiting records.  Fetal Ultrasounds or other Referrals:  Other:  unknown, patient reports prenatal care in [place], awaiting records.  Maternal Substance Abuse:  No Significant Maternal Medications:  None Significant Maternal Lab Results:  None Other Comments:  None  Review of Systems  All other systems reviewed and are negative.  Maternal Medical History:  Reason for admission: Contractions.   Contractions: Onset was 6-12 hours ago.   Frequency: regular.    Fetal activity: Perceived fetal activity is normal.   Last perceived fetal movement was within the past hour.    Prenatal Complications - Diabetes: none.    Dilation: 5.5 Effacement (%): 50 Station: -1 Exam by:: Carloyn Jaeger, CNM Blood pressure 109/68, pulse 60, temperature 98.1 F (36.7 C), temperature source Oral, resp. rate 17, height 5\' 7"  (1.702 m), weight 108 kg, last menstrual period 11/28/2019, SpO2 100 %, unknown if currently  breastfeeding. Maternal Exam:  Uterine Assessment: Contraction strength is firm.  Abdomen: Patient reports no abdominal tenderness. Introitus: Normal vulva. Normal vagina.  Cervix: Cervix evaluated by digital exam.     Fetal Exam Fetal Monitor Review: Baseline rate: 135.  Variability: moderate (6-25 bpm).   Pattern: accelerations present and no decelerations.    Fetal State Assessment: Category I - tracings are normal.     Physical Exam Vitals and nursing note reviewed. Exam conducted with a chaperone present.  Constitutional:      General: She is not in acute distress. HENT:     Head: Normocephalic.  Eyes:     Pupils: Pupils are equal, round, and reactive to light.  Cardiovascular:     Rate and Rhythm: Normal rate.  Abdominal:     Palpations: Abdomen is soft.     Tenderness: There is no abdominal tenderness.  Genitourinary:    General: Normal vulva.  Skin:    General: Skin is warm and dry.  Neurological:     Mental Status: She is alert and oriented to person, place, and time.  Psychiatric:        Mood and Affect: Mood normal.        Behavior: Behavior normal.     Prenatal labs: ABO, Rh: --/--/O POS (04/16 1212) Antibody: NEG (04/16 1212) Rubella:  pending  RPR:   pending  HBsAg:   pending  HIV:   pending  GBS:   pending   Assessment/Plan: 35 y.o. [id number] at [redacted]w[redacted]d  Admit to labor and delivery  Active labor Patient control as needed  GBS unknown, but preterm will start [facility] Preterm, will give BMZ  Will attempt to get records from [facility] Anticipate NSVD  Thressa Sheller DNP, CNM  09/07/20  2:53 PM

## 2020-09-07 NOTE — MAU Note (Signed)
Pt arrived to MAU c/o contractions that started at 4am. Patient reported no vaginal bleeding/ or LOF. Positive feta l movement reported. [facility] she was checked and she stated that she was 4cm. on 4/13 for her GYN appointment Dr. Orvan Falconer and he stated that she was 4.5 cm.  efmn monitoring commenced

## 2020-09-08 DIAGNOSIS — O4703 False labor before 37 completed weeks of gestation, third trimester: Secondary | ICD-10-CM | POA: Diagnosis not present

## 2020-09-08 DIAGNOSIS — O479 False labor, unspecified: Secondary | ICD-10-CM | POA: Diagnosis present

## 2020-09-08 LAB — RPR: RPR Ser Ql: NONREACTIVE

## 2020-09-08 MED ORDER — BETAMETHASONE SOD PHOS & ACET 6 (3-3) MG/ML IJ SUSP
12.0000 mg | Freq: Once | INTRAMUSCULAR | Status: AC
  Administered 2020-09-08: 12 mg via INTRAMUSCULAR

## 2020-09-08 NOTE — Anesthesia Postprocedure Evaluation (Signed)
Anesthesia Post Note  Patient: Keshayla Schrum  Procedure(s) Performed: AN AD HOC LABOR EPIDURAL     Patient location during evaluation: Other Anesthesia Type: Epidural Level of consciousness: awake and alert and oriented Pain management: satisfactory to patient Vital Signs Assessment: post-procedure vital signs reviewed and stable Respiratory status: respiratory function stable Cardiovascular status: stable Postop Assessment: no headache, no backache, epidural receding, patient able to bend at knees, no signs of nausea or vomiting, adequate PO intake and able to ambulate Anesthetic complications: no Comments: False labor, discharged to home.   No complications documented.  Last Vitals:  Vitals:   09/08/20 0933 09/08/20 1205  BP: 129/86 111/62  Pulse: 62 66  Resp: 16 16  Temp:  36.6 C  SpO2:      Last Pain:  Vitals:   09/08/20 1205  TempSrc: Axillary  PainSc: 0-No pain   Pain Goal: Patients Stated Pain Goal: 0 (09/07/20 1010)                 Vinson Tietze

## 2020-09-08 NOTE — Care Management CC44 (Signed)
Condition Code 44 Documentation Completed  Patient Details  Name: Samantha Bradford MRN: [id number] Date of Birth: [birth year]   Condition Code 44 given:  Yes Patient signature on Condition Code 44 notice:  Yes Documentation of 2 MD's agreement:  Yes Code 44 added to claim:  Yes    Bess Kinds, RN 09/08/2020, 12:19 PM

## 2020-09-08 NOTE — Discharge Summary (Signed)
Postpartum Discharge Summary    Patient Name: Samantha Bradford DOB: 1985/08/30 MRN: [id number]  Date of admission: 09/07/2020 Date of discharge: 09/08/2020  Admitting diagnosis: Normal labor [O80, Z37.9] Irregular contractions [O47.9] Intrauterine pregnancy: [redacted]w[redacted]d    Secondary diagnosis:  Active Problems:   Normal labor   Irregular contractions  Additional problems: None    Discharge diagnosis: False labor                                              Hospital course: Admitted for possible active labor, cervix remained unchanged overnight, ctx slowed to irregular q5-174m  Magnesium Sulfate received: No BMZ received: Yes Rhophylac:N/A MMR:No T-DaP:Given prenatally Flu: No Transfusion:No  Physical exam  Vitals:   09/08/20 0801 09/08/20 0906 09/08/20 0933 09/08/20 1205  BP: 112/81 121/72 129/86 111/62  Pulse: 69 66 62 66  Resp: 16 18 16 16   Temp:  98.2 F (36.8 C)  97.9 F (36.6 C)  TempSrc:  Oral  Axillary  SpO2:      Weight:      Height:       Constitutional: Well-developed, well-nourished pregnant female in no acute distress.  HEENT: PERRLA Skin: normal color and turgor, no rash Cardiovascular: normal rate & rhythm, no murmur Respiratory: normal effort, lung sounds clear throughout GI: Abd soft, non-tender, pos BS x 4, gravid appropriate for gestational age MS: Extremities nontender, no edema, normal ROM Neurologic: Alert and oriented x 4.  GU: no CVA tenderness Pelvic: NEFG, physiologic discharge, no blood, cervix clean and unchanged Dilation: 5 Effacement (%): 50 Cervical Position: Posterior Station: Ballotable Presentation: Vertex Exam by:: Stephanine Reas,cnm  Labs: Lab Results  Component Value Date   WBC 9.0 09/07/2020   HGB 11.7 (L) 09/07/2020   HCT 33.5 (L) 09/07/2020   MCV 85.0 09/07/2020   PLT 270 09/07/2020   CMP Latest Ref Rng & Units 09/06/2019  Glucose 70 - 99 mg/dL 101(H)  BUN 6 - 20 mg/dL 5(L)  Creatinine 0.44 - 1.00 mg/dL 1.01(H)  Sodium  135 - 145 mmol/L 139  Potassium 3.5 - 5.1 mmol/L 3.2(L)  Chloride 98 - 111 mmol/L 104  CO2 22 - 32 mmol/L 24  Calcium 8.9 - 10.3 mg/dL 9.0  Total Protein 6.5 - 8.1 g/dL 7.4  Total Bilirubin 0.3 - 1.2 mg/dL 0.7  Alkaline Phos 38 - 126 U/L 58  AST 15 - 41 U/L 17  ALT 0 - 44 U/L 15   Edinburgh Score: No flowsheet data found.   After visit meds:  Allergies as of 09/08/2020   No Known Allergies     Medication List    STOP taking these medications   metroNIDAZOLE 0.75 % vaginal gel Commonly known as: METROGEL VAGINAL     TAKE these medications   metoCLOPramide 10 MG tablet Commonly known as: REGLAN Take 1 tablet (10 mg total) by mouth every 8 (eight) hours as needed for nausea.   multivitamin-prenatal 27-0.8 MG Tabs tablet Take 1 tablet by mouth daily at 12 noon.      Discharge home in stable condition Cervix remained unchanged and preterm, cannot augment. Per Dr. AnHarolyn Rutherfordpt cleared to go home. Discussed plan with patient who was irritated but amenable to discharge given her absence of cervical change.  Of note: while waiting for discharge, per pt: "I tried to get up but my knee buckled so I sat back  down." Pt initially reported this as "falling out of bed" but stated the above when questioned. RN had instructed pt repeatedly NOT to try to get up without help. Questioned pt twice about incident, she insisted she did not fall to the floor just sat back down.   Discharge instruction: see AVS Activity: routine Diet: routine diet Future Appointments: Follow up in [place] with regular OB provider as scheduled or return to [facility] for labor evaluation when contractions are strong and regular.  Gaylan Gerold, CNM, MSN, [place] Certified Nurse Midwife, [facility]

## 2020-09-08 NOTE — Progress Notes (Signed)
Upon entering room, pt had moved from bed to chair again without supervision. Asked once again not to do that.  Walked around room with 2 nurses at side with no incident.  Charge nurse then comes in and assesses pt walking on her own and then going to bathroom unassisted.  Charge nurse says she feels comfortable with pt d/c home.

## 2020-09-08 NOTE — Care Management Obs Status (Signed)
MEDICARE OBSERVATION STATUS NOTIFICATION   Patient Details  Name: Samantha Bradford MRN: [id number] Date of Birth: 1986-03-15   Medicare Observation Status Notification Given:  Yes    Bess Kinds, RN 09/08/2020, 12:19 PM

## 2020-09-08 NOTE — Progress Notes (Signed)
RN goes in to check on pt and pr reports that she just fell.  RN had made it very clear to Samantha Bradford than she was not to get out of bed on her own previously.  After further questioning, pt says she went to bear weight on numb leg and that it buckled under her so she kept hanging on to siderail and was able to get back into bed.  Pt also says that at no point did she hit the floor at all.  RN again tells pt not to get out of bed or even attempt to unassisted.

## 2020-09-09 LAB — GC/CHLAMYDIA PROBE AMP (~~LOC~~) NOT AT ARMC
Chlamydia: NEGATIVE
Comment: NEGATIVE
Comment: NORMAL
Neisseria Gonorrhea: NEGATIVE

## 2020-09-09 LAB — RUBELLA SCREEN: Rubella: 1.63 index (ref >0.99)

## 2023-11-29 NOTE — ED Provider Notes (Addendum)
 EMERGENCY DEPARTMENT NOTE:    TRIAGE CHIEF COMPLAINT: Chief Complaint  Patient presents with   Abdominal Pain    Onset 10 days ago of ABD/pelvic cramping. States normally has this with her periods just not this intense. Started period 7/2-7/5 and continues to cramp. States hurts worse with intercourse.  Worse than labor pains per patient. Has had some nausea but no vomiting. Took pregnancy test at home that was negative. [id number]    HPI:  Samantha Bradford is a 38yrs, Female  who presents with  Chief Complaint  Patient presents with   Abdominal Pain    Onset 10 days ago of ABD/pelvic cramping. States normally has this with her periods just not this intense. Started period 7/2-7/5 and continues to cramp. States hurts worse with intercourse.  Worse than labor pains per patient. Has had some nausea but no vomiting. Took pregnancy test at home that was negative. [id number]  .  38 year old female with history of depression who presents to the emergency department secondary to bilateral lower abdominal pain that began 10 days ago.  She states that it began on the left and then radiated to the right side.  The pain is now on the right side and radiates from the right lower abdomen into the right flank.  She states that it is sharp and crampy.  She rates it a 7 out of 10 in intensity at the current time.  The pain is accompanied by nausea.  No vomiting or diarrhea.  No constipation.  Her last bowel movement was yesterday.  The patient's last menstrual period ended yesterday.  She states that it was normal for her.  She did suspect pregnancy and performed a home pregnancy test yesterday which was negative.  The patient admits to smoking marijuana.  She states that she does not drink any alcohol or use any other illicit drugs.   REVIEW OF SYSTEMS:   Constitutional: no fever Eyes: no blurred vision Respiratory: no cough, no shortness of breath Cardiovascular: no chest pain GI: Positive for nausea,  no vomiting, no diarrhea Genitourinary: no dysuria.  No vaginal bleeding or discharge. Extremities: no myalgias Neurologic: No new focal lateralizing weakness, numbness, or paresthesias. Skin: no rashes Hematologic: no bruising, no bleeding  IMMUNIZATIONS: Up to Date. PAST MEDICAL HISTORY: Past Medical History[1] FAMILY HISTORY: Family History[2] SOCIAL HISTORY: Social History   Tobacco Use   Smoking status: Every Day    Current packs/day: 0.50    Types: Cigarettes   Smokeless tobacco: Never  Substance Use Topics   Alcohol use: Not Currently   SURGICAL HISTORY: Past Surgical History[3] CURRENT MEDICATIONS: CLICK HERE TO EXPAND MEDICATION LIST[4] ALLERGIES: Patient has no known allergies.  PHYSICAL EXAM: Patient Vitals for the past 24 hrs:  BP Temp Pulse Resp SpO2 Height Weight  11/29/23 0646 106/62 -- 66 18 100 % -- --  11/29/23 0428 122/82 -- -- -- -- -- --  11/29/23 0424 -- 36.3 C (97.4 F) 66 17 100 % 1.702 m (5' 7) 105.8 kg (233 lb 3.2 oz)   CONSTITUTIONAL:  Awake and alert.  No obvious distress and non-toxic appearing. HEENT:  Atraumatic and normocephalic.  PERRL, EOMI.  Nares clear.  Oropharynx clear, no exudate and moist pink mucosa.  Airway patent.  No lymphadenopathy.  No meningismus. CARDIOVASCULAR:  Normal rate, regular rhythm, without murmur, rub or gallop. PULMONARY/CHEST:  Symmetrical and non-tender.  Clear to auscultation bilaterally.  No wheezes, rales or rhonci. ABDOMEN:  Soft, non-distended,  no rebound, no guarding, no  peritoneal signs, no masses or organomegaly.  no CVAT. Minimal to moderate tenderness to palpation across the lower abdomen bilaterally, right greater than left. Pelvic exam: Normal external female genitalia.  Minimal whitish vaginal discharge noted.  The os is closed.  No cervical motion tenderness.  No left adnexal tenderness.  She has significant right adnexal tenderness to palpation. EXTREMITIES:  2+ pulses, no deformities, no  clubbing, no cyanosis or edema. NEUROLOGICAL:  No new focal lateralizing neuro deficits.  HRD84.  Cranial nerves grossly intact.  No cerebellar deficits.  Motor strength 5 over 5 4 extremities.  Sensation intact 4 extremities. SKIN:  Warm and dry, no erythema and no rash.   Good Capillary Refill.  Pertinent Labs and Imaging studies reviewed.  (See Chart for Details) Results for orders placed or performed during the hospital encounter of 11/29/23 (from the past 24 hours)  (PANEL)-CBC WITH DIFFERENTIAL   Narrative   The following orders were created for panel order (PANEL)-CBC WITH DIFFERENTIAL. Procedure                               Abnormality         Status                    ---------                               -----------         ------                    CBC WITH DIFFERENTIAL[[id number]]        Abnormal            Final result               Please view results for these tests on the individual orders.  BASIC METABOLIC PANEL  Test Value Low-High   BUN 13 7 - 19 mg/dL   Sodium 861 863 - 854 mEq/L   Potassium 3.4 (L) 3.5 - 5.1 mEq/L   Chloride 104 98 - 107 mEq/L   CO2 24 22 - 29 mEq/L   Anion Gap 10 4 - 12 mEq/L   Glucose 84 70 - 105 mg/dL   Creatinine 9.18 9.44 - 1.30 mg/dL   Glomerular Filtration Rate 97 >=59 mL/Min/1.73 m2   Calcium 8.9 8.4 - 10.2 mg/dL   Osmo (Calc'd) 713 mOsm/kg   Alw:Rmzju Ratio 16.05   HEPATIC FUNCTION PANEL  Test Value Low-High   Albumin 3.7 3.5 - 5.2 g/dL   Bilirubin, Total 0.3 0.1 - 1.2 mg/dL   Bilirubin, Direct 0.2 <=0.5 mg/dL   Alk Phosphatase 63 40 - 150 U/L   SGOT (AST) 12 5 - 34 U/L   SGPT (ALT) 12 0 - 55 U/L   Protein, Total 7.1 6.4 - 8.3 g/dL   Bilirubin, Indirect 0.1 mg/dL   Globulin (Calc'd) 3.4 g/dL   A:G Ratio 8.90   LIPASE  Test Value Low-High   Lipase 19 8 - 78 U/L  CBC WITH DIFFERENTIAL  Test Value Low-High   WBC (White Blood Cell Count) 8.51 4.50 - 11.00 k/uL   RBC (Red Blood Cell Count) 4.09 3.80 - 5.20 M/uL   Hemoglobin  11.6 (L) 12.0 - 16.0 g/dL   Hematocrit 66.9 (L) 64.9 - 47.0 %   MCV (Mean Corpuscular Volume) 80.7 80.0 -  100.0 fL   MCH (Mean Corpuscular Hemoglobin) 28.4 26.0 - 34.0 pg   MCHC (Mean Corpuscular Hemoglobin Concentration) 35.2 32.0 - 36.0 g/dL   RDW (Red Cell Distribution Width) 12.8 11.5 - 14.5 %   Platelet Count 407 150 - 440 k/uL   MPV (Mean Platelet Volume) 9.1 7.4 - 10.6 fL   Neutrophils (%) 55 %   Lymphocytes (%) 36 %   Monocytes (%) 8 %   Eosinophils (%) 1 %   Basophils (%) 0 %   Immature Granulocytes (%) 1 %   Absolute Neutrophils (#) 4.64 1.80 - 7.70 k/uL   Absolute Lymphocytes (#) 3.07 1.00 - 4.80 k/uL   Absolute Monocytes (#) 0.65 0.00 - 0.80 k/uL   Absolute Eosinophils (#) 0.09 0.00 - 0.50 k/uL   Absolute Basophils (#) 0.02 0.00 - 0.20 k/uL   Absolute Immature Granulocytes (#) 0.04 (H) 0 k/uL  PREGNANCY SCREEN, URINE  Test Value Low-High   HCG, Urine Negative Negative  (PANEL)-URINALYSIS, COMPLETE   Narrative   The following orders were created for panel order (PANEL)-URINALYSIS, COMPLETE. Procedure                               Abnormality         Status                    ---------                               -----------         ------                    URINALYSIS, COMPLETE[[id number]]         Abnormal            Final result              URINALYSIS, MICROSCOPIC[[id number]]      Abnormal            Final result               Please view results for these tests on the individual orders.  URINALYSIS, COMPLETE  Test Value Low-High   Color, Urine Yellow Colorless, Yellow, or Straw   Clarity, Urine Clear Clear   Specific Gravity, Urine >1.030 (H) 1.003 - 1.030   pH, Urine 6.0 5.0 - 8.0   Hemoglobin, Urine 1+ (A) Negative   Bilirubin, Urine Negative Negative   Urobilinogen, Urine 1+ (A) Normal   Ketones, Urine Negative Negative   Glucose, Urine Negative Negative   Nitrites, Urine Negative Negative   Leukocyte Esterase, Urine 3+ (A) Negative   Protein, Urine Trace  (A) Negative  URINALYSIS, MICROSCOPIC  Test Value Low-High   RBC, URINE 1-4 (A) None Seen /HPF   WBC, Urine 10-19 (A) None Seen /HPF   Squamous Epithelial Cells, Urine Few (A) None Seen /HPF   Bacteria, Urine Few (A) None Seen  CT ABDOMEN PELVIS W IV CONTRAST   Narrative   Indication: Right lower quadrant abdominal pain.  TECHNIQUE: Contiguous axial images of the abdomen and pelvis were obtained following the administration of intravenous contrast.    Impression   IMPRESSION:  The spleen and pancreas and adrenal glands are normal. The kidneys are normal. No aortic aneurysm or dissection. Normal lung bases. Moderate hepatomegaly measuring 25 cm in its craniocaudal dimension  without liver mass.  Focal inflammation in the right lower quadrant suspicious for abscess formation. Tubo-ovarian abscess is in the differential diagnosis with a somewhat tubular appearance measuring up to 5 x 4.3 cm. The inflammation is in close proximity to the expected location of the appendix tissues not well delineated and appendicitis with periappendiceal abscess is also the differential diagnosis. Correlation with transabdominal transvaginal pelvic sonography may be prudent. The inflammation is also contiguous with the splenic flexure. No convincing evidence of diverticulosis.  Repeat CT with IV and oral contrast could be helpful. Lack of oral contrast diminishes the sensitivity and specificity of bowel pathology.  Induration of the peritoneal reflections throughout the pelvis bilaterally suggesting possible associated peritonitis. This finding can be seen in the setting of pelvic inflammatory disease further suggesting possible gynecological pathology.  Small amount of ascites seen dependently within the pelvis. No evidence of pancreatitis or nephrolithiasis. No aortic aneurysm or dissection. Normal urinary bladder. The uterus has a normal appearance.    Reading Doctor: Clyda Berg Electronic Signature  by: Clyda Berg    MEDICAL DECISION MAKING / ED COURSE: I reviewed the past medical, family and social history sections; including the medications, nurses notes, VS,  and allergies listed in the above record.    Will perform a workup to rule out pregnancy, ectopic pregnancy, bowel obstruction, bowel perforation, appendicitis, cholecystitis, diverticulitis, cystitis, pyelonephritis, viral gastroenteritis, etc.  CT scan of the abdomen and pelvis with IV contrast is worrisome for potential tubo-ovarian abscess versus appendicitis.  The radiologist recommended a transvaginal ultrasound as well as a repeat CT scan of the abdomen with oral and IV contrast.  I called the radiology department about this and requested that they give the patient oral contrast now.  The ultrasound has also been ordered.  I discussed all of this with the patient.  8 AM: I am turning this patient over to Dr. Corinthia at shift change.   IMPRESSION:   ICD-10-CM  1. Abdominal pain, generalized  R10.84  2. Nausea  R11.0  3. Hypokalemia  E87.6    MEDICATIONS ADMINISTERED DURING THIS ED VISIT: Medications  iohexoL  (OMNIPAQUE ) 350 mg iodine/mL solution 35,000 mg (100 mL Intravenous $ Given 11/29/23 0618)  iohexoL  (OMNIPAQUE ) 350 mg iodine/mL solution 10,500 mg (30 mL Oral $ Given 11/29/23 0737)  cefePIME (MAXIPIME) 2 g in sodium chloride  0.9 % 100 mL IVPB (2 g Intravenous $ New Bag/Syringe 11/29/23 0748)  iohexoL  (OMNIPAQUE ) 350 mg iodine/mL solution 35,000 mg (has no administration in time range)  sodium chloride  0.9 % bolus 1,000 mL (0 mL Intravenous Stopped 11/29/23 0524)  morphine 4 mg/mL injection 4 mg (4 mg Intravenous $ Given 11/29/23 0455)  ondansetron  (PF) (ZOFRAN ) injection 4 mg (4 mg Intravenous $ Given 11/29/23 0454)  potassium chloride (KLOR-CON M20) CR tablet 40 mEq (40 mEq Oral $ Given 11/29/23 0747)  morphine 4 mg/mL injection 4 mg (4 mg Intravenous $ Given 11/29/23 0748)  ondansetron  (PF) (ZOFRAN ) injection 4  mg (4 mg Intravenous $ Given 11/29/23 0748)     MEDICATIONS:  Listed below are any new, modified or discontinued meds.  The previous medications are the medications which the patient was taking upon presentation and were not altered or changed unless otherwise specified.  Patient's Medications  New Prescriptions   No medications on file  Previous Medications   QUETIAPINE FUMARATE (SEROQUEL ORAL)    Take 1 Tablet by mouth daily.  Modified Medications   No medications on file  Discontinued Medications   No  medications on file     The patient and I reviewed signs/symptoms of worsening or progressing illness that could develop, and would require seeking urgent or emergent care. The patient expressed understanding and agreement.  The patient verbalized understanding of instructions and all things reviewed today.    THE PATIENT WAS INSTRUCTED TO FOLLOWUP AS LISTED BELOW: No follow-up provider specified. No improvement or sooner if worsening  DATE OF SERVICE:  11/29/2023             [1] Past Medical History: Diagnosis Date   Depression   [2] No family history on file. [3] No past surgical history on file. [4] Current Facility-Administered Medications  Medication Dose Route Frequency Provider Last Rate Last Admin   cefePIME (MAXIPIME) 2 g in sodium chloride  0.9 % 100 mL IVPB  2 g Intravenous EVERY 8 HOURS Madelaine Redell Dover, MD 0.42 mL/hr at 11/29/23 0748 2 g at 11/29/23 0748   iohexoL  (OMNIPAQUE ) 350 mg iodine/mL solution 10,500 mg  30 mL Oral ONCE Madelaine Redell Dover, MD   30 mL at 11/29/23 0737   iohexoL  (OMNIPAQUE ) 350 mg iodine/mL solution 35,000 mg  100 mL Intravenous ONCE Lowry, Brian Patrick, MD   100 mL at 11/29/23 9381   iohexoL  (OMNIPAQUE ) 350 mg iodine/mL solution 35,000 mg  100 mL Intravenous ONCE Lowry, Brian Patrick, MD       Current Outpatient Medications  Medication Sig Dispense Refill   quetiapine fumarate (SEROQUEL ORAL) Take 1 Tablet by mouth  daily.

## 2023-12-01 NOTE — Discharge Summary (Signed)
 Patient Name: Samantha Bradford Address:  [address] [place] [address] Date of Birth: 06-22-1985 MRN: [id number] Today's Date: 12/01/2023  Referring MD:  Corinthia Hamilton, MD Admitting MD:  Eleanor KANDICE Decent, MD Primary MD: Doctor Sampson, MD  GYN Discharge Summary  Date of Admission: 11/29/2023                                                Date of Discharge:  12/01/23      Hospital Course: Patient was admitted on 11/29/2023 with pelvic inflammatory disease and a finding of a tubo-ovarian abscess on the right.  Patient was administered Rocephin, metronidazole , and doxycycline.  After 24 hours she defervesced and had no fever.  Her white count was within normal limits.  On hospital day 2 she continues to do well and have less pain.  She was ambulatory in the hallway.  She was tolerating a regular diet and voiding without difficulty.  She was afebrile and had minimal pain on exam.  She completed her third dose of Rocephin.  She was felt to be stable for outpatient therapy.  She was discharged home.  Past Medical History:  Past Medical History[1]  Past Surgical history: Past Surgical History[2] GYN History:  History of STDs: Positive: GC, Treated, CT, Treated, Trichomoniasis, Treated Social History:  Social History[3] Family History:  Family History[4] Home Medications:  Medications Ordered Prior to Encounter[5] Allergies:  Allergies[6]   Discharge Diagnosis: Active Hospital Problems   Diagnosis Date Noted   *Tubo-ovarian abscess 11/29/2023    Resolved Hospital Problems  No resolved problems to display.    Discharge Instructions: Take antibiotics as prescribed. No unprotected sex. RTO 2 weeks for TVUS and recheck  Medications/RX:    Medication List     START taking these medications      Instructions  doxycycline 100 mg Tab Commonly known as: VIBRA-TABS  Take 1 Tablet by mouth twice a day for 5 days. Take 2 hours before or 4 hours after calcium, zinc, iron, carafate,  multiple vitamins with minerals, or antacids.  Indications: Pelvic Inflammatory Disease   ibuprofen  600 mg Tab Commonly known as: ADVIL ,MOTRIN   Take 1 Tablet by mouth every 6 hours as needed for Pain.   metroNIDAZOLE  500 mg Tab Commonly known as: FLAGYL   Take 1 Tablet by mouth twice a day with meals for 4 days. Give with food and a full glass of water to reduce GI upset.  Indications: Pelvic Inflammatory Disease       CHANGE how you take these medications      Instructions  SeroqueL 25 mg Tab Generic drug: QUEtiapine What changed: Another medication with the same name was removed. Continue taking this medication, and follow the directions you see here.  every 8 hours.       CONTINUE taking these medications      Instructions  Wellbutrin XL 150 mg Tb24 Generic drug: buPROPion XL  1 tab(s) orally every 24 hours for 30 day(s)       STOP taking these medications    FLUoxetine 20 mg Cap Commonly known as: PROzac        Discharge Physician: Lynwood JONETTA Cap, MD  CC:   Lynwood JONETTA Cap, MD 12/01/2023         [1] Past Medical History: Diagnosis Date   Depression   [2] No past surgical history  on file. [3] Social History Socioeconomic History   Marital status: Single  Tobacco Use   Smoking status: Every Day    Current packs/day: 0.50    Types: Cigarettes   Smokeless tobacco: Never  Vaping Use   Vaping status: Never Used  Substance and Sexual Activity   Alcohol use: Not Currently   Drug use: Yes    Types: Marijuana   Sexual activity: Yes    Partners: Male   Social Drivers of Health   Tobacco Use: High Risk (12/01/2023)   Patient History    Smoking Tobacco Use: Every Day    Smokeless Tobacco Use: Never  Food Insecurity: No Food Insecurity (11/30/2023)   Hunger Vital Sign    Worried About Running Out of Food in the Last Year: Never true    Ran Out of Food in the Last Year: Never true  Transportation Needs: No Transportation Needs  (11/30/2023)   PRAPARE - Administrator, Civil Service (Medical): No    Lack of Transportation (Non-Medical): No  Intimate Partner Violence: Not At Risk (11/30/2023)   Humiliation, Afraid, Rape, and Kick questionnaire    Fear of Current or Ex-Partner: No    Emotionally Abused: No    Physically Abused: No    Sexually Abused: No  Housing Stability: Unknown (11/30/2023)   Housing Stability Vital Sign    Unable to Pay for Housing in the Last Year: No    Homeless in the Last Year: No  Utilities: Not At Risk (11/30/2023)   [facility] Utilities    Threatened with loss of utilities: No  [4] No family history on file. [5] No current facility-administered medications on file prior to encounter.   Current Outpatient Medications on File Prior to Encounter  Medication Sig Dispense Refill   buPROPion XL (Wellbutrin XL) 150 mg Oral Tablet Sustained Release 24HR 1 tab(s) orally every 24 hours for 30 day(s)     FLUoxetine (PROzac) 20 mg Oral Capsule Take 1 Capsule by mouth daily.     QUEtiapine (SeroqueL) 25 mg Oral Tablet every 8 hours.     quetiapine fumarate (SEROQUEL ORAL) Take 1 Tablet by mouth daily.    [6] Allergies Allergen Reactions   Latex, Natural Rubber Itching

## 2024-06-12 ENCOUNTER — Encounter (HOSPITAL_COMMUNITY): Payer: Self-pay

## 2024-06-12 ENCOUNTER — Other Ambulatory Visit: Payer: Self-pay

## 2024-06-12 ENCOUNTER — Emergency Department (HOSPITAL_COMMUNITY)

## 2024-06-12 ENCOUNTER — Emergency Department (HOSPITAL_COMMUNITY)
Admission: EM | Admit: 2024-06-12 | Discharge: 2024-06-12 | Disposition: A | Discharge status: Home / Self Care | Attending: Emergency Medicine | Admitting: Emergency Medicine

## 2024-06-12 DIAGNOSIS — N3000 Acute cystitis without hematuria: Secondary | ICD-10-CM | POA: Insufficient documentation

## 2024-06-12 DIAGNOSIS — R1031 Right lower quadrant pain: Secondary | ICD-10-CM | POA: Diagnosis present

## 2024-06-12 DIAGNOSIS — B379 Candidiasis, unspecified: Secondary | ICD-10-CM | POA: Diagnosis not present

## 2024-06-12 DIAGNOSIS — N83209 Unspecified ovarian cyst, unspecified side: Secondary | ICD-10-CM

## 2024-06-12 DIAGNOSIS — N83201 Unspecified ovarian cyst, right side: Secondary | ICD-10-CM | POA: Diagnosis not present

## 2024-06-12 LAB — CBC WITH DIFFERENTIAL/PLATELET
Abs Immature Granulocytes: 0.01 K/uL (ref 0.00–0.07)
Basophils Absolute: 0 K/uL (ref 0.0–0.1)
Basophils Relative: 0 %
Eosinophils Absolute: 0.1 K/uL (ref 0.0–0.5)
Eosinophils Relative: 1 %
HCT: 38.9 % (ref 36.0–46.0)
Hemoglobin: 13.5 g/dL (ref 12.0–15.0)
Immature Granulocytes: 0 %
Lymphocytes Relative: 48 %
Lymphs Abs: 2.9 K/uL (ref 0.7–4.0)
MCH: 28.9 pg (ref 26.0–34.0)
MCHC: 34.7 g/dL (ref 30.0–36.0)
MCV: 83.3 fL (ref 80.0–100.0)
Monocytes Absolute: 0.5 K/uL (ref 0.1–1.0)
Monocytes Relative: 8 %
Neutro Abs: 2.6 K/uL (ref 1.7–7.7)
Neutrophils Relative %: 43 %
Platelets: 305 K/uL (ref 150–400)
RBC: 4.67 MIL/uL (ref 3.87–5.11)
RDW: 12.8 % (ref 11.5–15.5)
WBC: 6.1 K/uL (ref 4.0–10.5)
nRBC: 0 % (ref 0.0–0.2)

## 2024-06-12 LAB — URINALYSIS, ROUTINE W REFLEX MICROSCOPIC
Bacteria, UA: NONE SEEN
Bilirubin Urine: NEGATIVE
Glucose, UA: NEGATIVE mg/dL
Hgb urine dipstick: NEGATIVE
Ketones, ur: NEGATIVE mg/dL
Nitrite: POSITIVE — AB
Protein, ur: NEGATIVE mg/dL
Specific Gravity, Urine: 1.018 (ref 1.005–1.030)
pH: 6 (ref 5.0–8.0)

## 2024-06-12 LAB — WET PREP, GENITAL
Clue Cells Wet Prep HPF POC: NONE SEEN
Sperm: NONE SEEN
Trich, Wet Prep: NONE SEEN
WBC, Wet Prep HPF POC: <10

## 2024-06-12 LAB — I-STAT CHEM 8, ED
BUN: 16 mg/dL (ref 6–20)
Calcium, Ion: 1.08 mmol/L — ABNORMAL LOW (ref 1.15–1.40)
Chloride: 106 mmol/L (ref 98–111)
Creatinine, Ser: 1 mg/dL (ref 0.44–1.00)
Glucose, Bld: 75 mg/dL (ref 70–99)
HCT: 37 % (ref 36.0–46.0)
Hemoglobin: 12.6 g/dL (ref 12.0–15.0)
Potassium: 3.5 mmol/L (ref 3.5–5.1)
Sodium: 142 mmol/L (ref 135–145)
TCO2: 21 mmol/L — ABNORMAL LOW (ref 22–32)

## 2024-06-12 LAB — RAPID HIV SCREEN (HIV 1/2 AB+AG)
HIV 1/2 Antibodies: NONREACTIVE
HIV-1 P24 Antigen - HIV24: NONREACTIVE

## 2024-06-12 LAB — COMPREHENSIVE METABOLIC PANEL WITH GFR
ALT: 8 U/L (ref 0–44)
AST: 19 U/L (ref 15–41)
Albumin: 4.3 g/dL (ref 3.5–5.0)
Alkaline Phosphatase: 75 U/L (ref 38–126)
Anion gap: 12 (ref 5–15)
BUN: 16 mg/dL (ref 6–20)
CO2: 23 mmol/L (ref 22–32)
Calcium: 9.2 mg/dL (ref 8.9–10.3)
Chloride: 105 mmol/L (ref 98–111)
Creatinine, Ser: 1.02 mg/dL — ABNORMAL HIGH (ref 0.44–1.00)
GFR, Estimated: >60 mL/min
Glucose, Bld: 76 mg/dL (ref 70–99)
Potassium: 4.1 mmol/L (ref 3.5–5.1)
Sodium: 140 mmol/L (ref 135–145)
Total Bilirubin: 0.6 mg/dL (ref 0.0–1.2)
Total Protein: 7.4 g/dL (ref 6.5–8.1)

## 2024-06-12 LAB — LIPASE, BLOOD: Lipase: 29 U/L (ref 11–51)

## 2024-06-12 LAB — HCG, SERUM, QUALITATIVE: Preg, Serum: NEGATIVE

## 2024-06-12 MED ORDER — CEPHALEXIN 500 MG PO CAPS: 1000.0000 mg | ORAL_CAPSULE | Freq: Two times a day (BID) | ORAL | 0 refills | Status: DC

## 2024-06-12 MED ORDER — FLUCONAZOLE 150 MG PO TABS
150.0000 mg | ORAL_TABLET | Freq: Once | ORAL | Status: AC
  Administered 2024-06-12: 150 mg via ORAL
  Filled 2024-06-12: qty 1

## 2024-06-12 MED ORDER — FLUCONAZOLE 150 MG PO TABS: 150.0000 mg | ORAL_TABLET | Freq: Every day | ORAL | 0 refills | Status: AC

## 2024-06-12 MED ORDER — CEPHALEXIN 500 MG PO CAPS: 1000.0000 mg | ORAL_CAPSULE | Freq: Two times a day (BID) | ORAL | 0 refills | Status: AC

## 2024-06-12 MED ORDER — IOHEXOL 350 MG/ML SOLN
75.0000 mL | Freq: Once | INTRAVENOUS | Status: AC | PRN
  Administered 2024-06-12: 75 mL via INTRAVENOUS

## 2024-06-12 MED ORDER — FLUCONAZOLE 150 MG PO TABS: 150.0000 mg | ORAL_TABLET | Freq: Every day | ORAL | 0 refills | Status: DC

## 2024-06-12 NOTE — ED Triage Notes (Signed)
 Patient here with complaints of RLQ abdominal pain that radiates into her back. Denies any urinary symptoms but she states that she has had this before and she had 3 STDS that turned into a abscess on her ovaries.  She reports the pain started 3 weeks ago. She had 2 menstrual cycles last month.    LMP: 05/21/2024.

## 2024-06-12 NOTE — ED Provider Triage Note (Signed)
 Emergency Medicine Provider Triage Evaluation Note  Samantha Bradford , a 39 y.o. female  was evaluated in triage.  Pt complains of lower quadrant abdominal pain and pelvic pain.  Patient has a history of a tubo-ovarian abscess and is concerned that is what is happening again.  Denies systemic symptoms including fever or chills.  No nausea or vomiting.  Currently denies chest pain or shortness of breath.  Review of Systems  Positive: Right lower quadrant abdominal pain, pelvic pain Negative: Vaginal discharge, nausea, vomiting, fever, chills  Physical Exam  BP 133/73   Pulse 70   Temp 98 F (36.7 C)   Resp 17   Ht 5' 7 (1.702 m)   Wt 106.6 kg   SpO2 98%   BMI 36.81 kg/m  Gen:   Awake, no distress   Resp:  Normal effort  MSK:   Moves extremities without difficulty  Other:  Right lower quadrant of abdomen/pelvic area tender to palpation  Medical Decision Making  Medically screening exam initiated at 2:06 PM.  Appropriate orders placed.  Samantha Bradford was informed that the remainder of the evaluation will be completed by another provider, this initial triage assessment does not replace that evaluation, and the importance of remaining in the ED until their evaluation is complete.  Orders: CBC, CMP, Chem-8, lipase, pregnancy, UA, wet prep, gonorrhea chlamydia, HIV, syphilis, CT Abdo pelvis with contrast   Janetta Terrall FALCON, PA-C 06/12/24 1407

## 2024-06-12 NOTE — Progress Notes (Addendum)
 Nursing requested this CSW assist with childcare while patient is at US . Patient's daughter with this CSW in ED ICM office, given snacks and currently watching a movie. [phone] with any concerns.   6:42 - Call from nursing, patient finished with test, daughter returned to room 29

## 2024-06-12 NOTE — ED Provider Notes (Signed)
 " [facility] Provider Note   CSN: [id number] Arrival date & time: 06/12/24  1302     Patient presents with: Abdominal Pain   Samantha Bradford is a 39 y.o. female with prior medical history of TOA,, who presents emergency department for evaluation of right lower quadrant abdominal pain x 3 weeks.  Patient reports the pain resides in her lower abdomen and goes straight through to her back.  Additionally, patient is reporting vaginal itching that started a couple of days ago.  She states this is ultimately what brought her to the emergency department.  Patient does report some hot and cold sweats as well.  She denies any fever.  Patient reports some intermittent nausea without vomiting.  She states she is moved to the area, and does not have care with a PCP or OB/GYN.  Patient states her menstrual cycle is regular, and her last cycle was around 12/26.    Abdominal Pain      Prior to Admission medications  Medication Sig Start Date End Date Taking? Authorizing Provider  cephALEXin  (KEFLEX ) 500 MG capsule Take 2 capsules (1,000 mg total) by mouth 2 (two) times daily. 06/12/24   Ripley Bogosian, Marry RAMAN, PA-C  fluconazole  (DIFLUCAN ) 150 MG tablet Take 1 tablet (150 mg total) by mouth daily. Take the second dose of the antiyeast medicine when you finish antibiotics in 5 days. 06/12/24   Tobin Cadiente, Marry RAMAN, PA-C  metoCLOPramide  (REGLAN ) 10 MG tablet Take 1 tablet (10 mg total) by mouth every 8 (eight) hours as needed for nausea. 02/26/20   Jerilynn Longs, NP  Prenatal Vit-Fe Fumarate-FA (MULTIVITAMIN-PRENATAL) 27-0.8 MG TABS tablet Take 1 tablet by mouth daily at 12 noon.    [provider]    Allergies: Patient has no known allergies.    Review of Systems  Gastrointestinal:  Positive for abdominal pain.    Updated Vital Signs BP (!) 114/90   Pulse 70   Temp 98 F (36.7 C)   Resp 16   Ht 5' 7 (1.702 m)   Wt 106.6 kg   SpO2 100%   BMI  36.81 kg/m   Physical Exam Vitals and nursing note reviewed.  Constitutional:      Appearance: Normal appearance.  HENT:     Head: Normocephalic and atraumatic.     Mouth/Throat:     Mouth: Mucous membranes are moist.  Eyes:     General: No scleral icterus.       Right eye: No discharge.        Left eye: No discharge.     Conjunctiva/sclera: Conjunctivae normal.  Cardiovascular:     Rate and Rhythm: Normal rate and regular rhythm.     Pulses: Normal pulses.  Pulmonary:     Effort: Pulmonary effort is normal.     Breath sounds: Normal breath sounds.  Abdominal:     General: There is no distension.     Tenderness: There is abdominal tenderness in the right lower quadrant.     Comments: Mild tenderness to palpation on the right lower quadrant.  Musculoskeletal:        General: No deformity.     Cervical back: Normal range of motion.  Skin:    General: Skin is warm and dry.     Capillary Refill: Capillary refill takes less than 2 seconds.  Neurological:     Mental Status: She is alert.     Motor: No weakness.  Psychiatric:  Mood and Affect: Mood normal.     (all labs ordered are listed, but only abnormal results are displayed) Labs Reviewed  WET PREP, GENITAL - Abnormal; Notable for the following components:      Result Value   Yeast Wet Prep HPF POC PRESENT (*)    All other components within normal limits  COMPREHENSIVE METABOLIC PANEL WITH GFR - Abnormal; Notable for the following components:   Creatinine, Ser 1.02 (*)    All other components within normal limits  URINALYSIS, ROUTINE W REFLEX MICROSCOPIC - Abnormal; Notable for the following components:   APPearance HAZY (*)    Nitrite POSITIVE (*)    Leukocytes,Ua SMALL (*)    All other components within normal limits  I-STAT CHEM 8, ED - Abnormal; Notable for the following components:   Calcium, Ion 1.08 (*)    TCO2 21 (*)    All other components within normal limits  LIPASE, BLOOD  CBC WITH  DIFFERENTIAL/PLATELET  HCG, SERUM, QUALITATIVE  RAPID HIV SCREEN (HIV 1/2 AB+AG)  SYPHILIS: RPR W/REFLEX TO RPR TITER AND TREPONEMAL ANTIBODIES, TRADITIONAL SCREENING AND DIAGNOSIS ALGORITHM  GC/CHLAMYDIA PROBE AMP ([place]) NOT AT [facility]    EKG: None  Radiology: US  Pelvis Complete Result Date: 06/12/2024 CLINICAL DATA:  Abnormal CT EXAM: TRANSABDOMINAL AND TRANSVAGINAL ULTRASOUND OF PELVIS DOPPLER ULTRASOUND OF OVARIES TECHNIQUE: Both transabdominal and transvaginal ultrasound examinations of the pelvis were performed. Transabdominal technique was performed for global imaging of the pelvis including uterus, ovaries, adnexal regions, and pelvic cul-de-sac. It was necessary to proceed with endovaginal exam following the transabdominal exam to visualize the uterus endometrium adnexa. Color and duplex Doppler ultrasound was utilized to evaluate blood flow to the ovaries. COMPARISON:  CT 06/12/2024 FINDINGS: Uterus Measurements: 8 x 4.1 x 4.2 cm = volume: 65.9 mL. No fibroids or other mass visualized. Endometrium Thickness: 6.8 mm.  No focal abnormality visualized. Right ovary Measurements: 5.5 x 4.4 x 4.4 cm = volume:  56.3 mL. Complex cyst measuring 3.9 x 2.9 x 3.2 cm, indeterminate but probably benign and probably representing hemorrhagic cyst. Doppler: There is normal vascularity on color doppler examination. Spectral doppler arterial and venous waveforms are normal. Left ovary Measurements: 2.6 x 1.5 x 2.2 cm = volume: 4.4 mL. Normal appearance/no adnexal mass. Doppler: There is normal vascularity on color doppler examination. Spectral doppler arterial and venous waveforms are normal. Other findings Small free fluid IMPRESSION: 1. Negative for ovarian torsion. 2. 3.9 cm complex right ovarian cyst, indeterminate but probably benign and probably representing hemorrhagic cyst. Recommend 6-12 week sonographic follow-up. 3. Small free fluid. Electronically Signed   By: Luke Bun M.D.   On: 06/12/2024  18:59   US  Transvaginal Non-OB Result Date: 06/12/2024 CLINICAL DATA:  Abnormal CT EXAM: TRANSABDOMINAL AND TRANSVAGINAL ULTRASOUND OF PELVIS DOPPLER ULTRASOUND OF OVARIES TECHNIQUE: Both transabdominal and transvaginal ultrasound examinations of the pelvis were performed. Transabdominal technique was performed for global imaging of the pelvis including uterus, ovaries, adnexal regions, and pelvic cul-de-sac. It was necessary to proceed with endovaginal exam following the transabdominal exam to visualize the uterus endometrium adnexa. Color and duplex Doppler ultrasound was utilized to evaluate blood flow to the ovaries. COMPARISON:  CT 06/12/2024 FINDINGS: Uterus Measurements: 8 x 4.1 x 4.2 cm = volume: 65.9 mL. No fibroids or other mass visualized. Endometrium Thickness: 6.8 mm.  No focal abnormality visualized. Right ovary Measurements: 5.5 x 4.4 x 4.4 cm = volume:  56.3 mL. Complex cyst measuring 3.9 x 2.9 x 3.2 cm, indeterminate  but probably benign and probably representing hemorrhagic cyst. Doppler: There is normal vascularity on color doppler examination. Spectral doppler arterial and venous waveforms are normal. Left ovary Measurements: 2.6 x 1.5 x 2.2 cm = volume: 4.4 mL. Normal appearance/no adnexal mass. Doppler: There is normal vascularity on color doppler examination. Spectral doppler arterial and venous waveforms are normal. Other findings Small free fluid IMPRESSION: 1. Negative for ovarian torsion. 2. 3.9 cm complex right ovarian cyst, indeterminate but probably benign and probably representing hemorrhagic cyst. Recommend 6-12 week sonographic follow-up. 3. Small free fluid. Electronically Signed   By: Luke Bun M.D.   On: 06/12/2024 18:59   US  Art/Ven Flow Abd Pelv Doppler Result Date: 06/12/2024 CLINICAL DATA:  Abnormal CT EXAM: TRANSABDOMINAL AND TRANSVAGINAL ULTRASOUND OF PELVIS DOPPLER ULTRASOUND OF OVARIES TECHNIQUE: Both transabdominal and transvaginal ultrasound examinations of the  pelvis were performed. Transabdominal technique was performed for global imaging of the pelvis including uterus, ovaries, adnexal regions, and pelvic cul-de-sac. It was necessary to proceed with endovaginal exam following the transabdominal exam to visualize the uterus endometrium adnexa. Color and duplex Doppler ultrasound was utilized to evaluate blood flow to the ovaries. COMPARISON:  CT 06/12/2024 FINDINGS: Uterus Measurements: 8 x 4.1 x 4.2 cm = volume: 65.9 mL. No fibroids or other mass visualized. Endometrium Thickness: 6.8 mm.  No focal abnormality visualized. Right ovary Measurements: 5.5 x 4.4 x 4.4 cm = volume:  56.3 mL. Complex cyst measuring 3.9 x 2.9 x 3.2 cm, indeterminate but probably benign and probably representing hemorrhagic cyst. Doppler: There is normal vascularity on color doppler examination. Spectral doppler arterial and venous waveforms are normal. Left ovary Measurements: 2.6 x 1.5 x 2.2 cm = volume: 4.4 mL. Normal appearance/no adnexal mass. Doppler: There is normal vascularity on color doppler examination. Spectral doppler arterial and venous waveforms are normal. Other findings Small free fluid IMPRESSION: 1. Negative for ovarian torsion. 2. 3.9 cm complex right ovarian cyst, indeterminate but probably benign and probably representing hemorrhagic cyst. Recommend 6-12 week sonographic follow-up. 3. Small free fluid. Electronically Signed   By: Luke Bun M.D.   On: 06/12/2024 18:59   CT ABDOMEN PELVIS W CONTRAST Result Date: 06/12/2024 EXAM: CT ABDOMEN AND PELVIS WITH CONTRAST 06/12/2024 03:33:23 PM TECHNIQUE: CT of the abdomen and pelvis was performed with the administration of 75 mL of iohexol  (OMNIPAQUE ) 350 MG/ML injection. Multiplanar reformatted images are provided for review. Automated exposure control, iterative reconstruction, and/or weight-based adjustment of the mA/kV was utilized to reduce the radiation dose to as low as reasonably achievable. COMPARISON: 07/01/2018  CLINICAL HISTORY: Right lower quadrant abdominal pain, history of tubo-ovarian abscess. FINDINGS: LOWER CHEST: No acute abnormality. LIVER: The liver is unremarkable. GALLBLADDER AND BILE DUCTS: Gallbladder is unremarkable. No biliary ductal dilatation. SPLEEN: No acute abnormality. PANCREAS: No acute abnormality. ADRENAL GLANDS: No acute abnormality. KIDNEYS, URETERS AND BLADDER: No stones in the kidneys or ureters. No hydronephrosis. No perinephric or periureteral stranding. Urinary bladder is unremarkable. GI AND BOWEL: Stomach demonstrates no acute abnormality. There is no bowel obstruction. PERITONEUM AND RETROPERITONEUM: Small amount of free pelvic fluid. No free air. VASCULATURE: Aorta is normal in caliber. LYMPH NODES: No lymphadenopathy. REPRODUCTIVE ORGANS: Right ovarian cystic lesion 4.2 x 3.6 cm with mild internal complexity and internal density at 21 HU. Cannot exclude tubo-ovarian abscess or complex/hemorrhagic cyst. BONES AND SOFT TISSUES: Central disc protrusion at L5-S1, without overt impingement. No acute osseous abnormality. No focal soft tissue abnormality. IMPRESSION: 1. Right ovarian cystic lesion measuring 4.2 x 3.6  cm with mild internal complexity and small-volume free pelvic fluid, which may represent tubo-ovarian abscess or a complex or hemorrhagic cyst. 2. Central disc protrusion at L5-S1 without overt impingement. Electronically signed by: Ryan Salvage MD 06/12/2024 04:07 PM EST RP Workstation: [id number]    Procedures   Medications Ordered in the ED  iohexol  (OMNIPAQUE ) 350 MG/ML injection 75 mL (75 mLs Intravenous Contrast Given 06/12/24 1534)  fluconazole  (DIFLUCAN ) tablet 150 mg (150 mg Oral Given 06/12/24 1632)                                Medical Decision Making Amount and/or Complexity of Data Reviewed Radiology: ordered.  Risk Prescription drug management.   This patient presents to the ED for concern of abdominal pain, this involves an extensive number of  treatment options, and is a complaint that carries with it a high risk of complications and morbidity.   Differential diagnosis includes: Pregnancy, UTI, pyelonephritis, STI, ovarian torsion, TOA, appendicitis, cholecystitis, mesenteric ischemia  Co morbidities:  see above  Additional history:  Patient has a history of TOA, July 2025 for which she was admitted and given IV antibiotics.  Lab Tests:  I Ordered, and personally interpreted labs.  The pertinent results include:    - UA: Nitrite positive - Wet prep: Yeast present  Imaging Studies:  I ordered imaging studies including CT abdomen pelvis, transvaginal ultrasound I independently visualized and interpreted imaging which showed CT shows concern for possible TOA versus hemorrhagic cyst.  Ultrasound shows evidence of hemorrhagic cyst, that will require follow-up with OB/GYN I agree with the radiologist interpretation  Cardiac Monitoring/ECG:  The patient was maintained on a cardiac monitor.  I personally viewed and interpreted the cardiac monitored which showed an underlying rhythm of: Sinus rhythm  Medicines ordered and prescription drug management:  I ordered medication including  Medications  iohexol  (OMNIPAQUE ) 350 MG/ML injection 75 mL (75 mLs Intravenous Contrast Given 06/12/24 1534)  fluconazole  (DIFLUCAN ) tablet 150 mg (150 mg Oral Given 06/12/24 1632)   for yeast infection Reevaluation of the patient after these medicines showed that the patient improved I have reviewed the patients home medicines and have made adjustments as needed  Test Considered:   none  Critical Interventions:   none  Consultations Obtained: None  Problem List / ED Course:     ICD-10-CM   1. Acute cystitis without hematuria  N30.00     2. Yeast infection  B37.9     3. Hemorrhagic cyst of ovary  N83.209       MDM: 39 year old female who presents emergency department for evaluation of right lower quadrant abdominal pain.   Symptoms present for almost 3 weeks, making ovarian torsion less likely.  Patient also reporting back pain, so acute cystitis versus pyelonephritis are possibilities.  CT of patient's abdomen and pelvis was obtained, to rule out pyelo-.  Additionally, patient's wet prep was positive for yeast.  I did give her a dose of fluconazole  while in the emergency department and sent her a second pill to her pharmacy.  Patient's workup otherwise unremarkable.  No leukocytosis to suggest systemic infection.  However, patient's CT scan does show concern for possible TOA.  Trans vaginal ultrasound has been ordered.  Ultrasound shows hemorrhagic cyst.  No evidence of TOA.  Patient will require follow-up with OB/GYN in 6 to 8 weeks.  I did send her antibiotics to her pharmacy on file and have given her  the second dose of fluconazole  that she has been instructed to take when she completes antibiotics for her UTI.  Patient verbalized her understanding to these instructions.  OB/GYN follow-up has been given.  Patient's vital signs are stable.  She is appropriate for discharge at this time.   Dispostion:  After consideration of the diagnostic results and the patients response to treatment, I feel that the patient would benefit from antibiotics and OB/GYN follow-up.    Final diagnoses:  Acute cystitis without hematuria  Yeast infection  Hemorrhagic cyst of ovary    ED Discharge Orders          Ordered    cephALEXin  (KEFLEX ) 500 MG capsule  2 times daily,   Status:  Discontinued        06/12/24 1738    cephALEXin  (KEFLEX ) 500 MG capsule  2 times daily        06/12/24 1739    fluconazole  (DIFLUCAN ) 150 MG tablet  Daily,   Status:  Discontinued        06/12/24 1739    fluconazole  (DIFLUCAN ) 150 MG tablet  Daily        06/12/24 1921               Torrence Marry RAMAN, PA-C 06/12/24 2317  "

## 2024-06-12 NOTE — Discharge Instructions (Addendum)
 It was a pleasure taking care of you today. You were seen in the Emergency Department for evaluation of abdominal pain. Your work-up was reassuring. Your CT/Xray/Labs showed evidence of a urinary tract infection, yeast infection and hemorrhagic cyst on your ovary.  You did receive the first dose of medicine for your yeast infection while in the emergency department.  Please take the second dose of fluconazole  when you finish your antibiotics in 5 days.  I have sent the antibiotics to treat your yeast infection to your pharmacy on file.  Please follow the directions on the prescription for how to take this medicine.  Additionally, you will require follow-up with an OB/GYN in about 6 to 8 weeks.  I have provided a referral on your discharge paperwork.  Please call them to schedule an appointment.. Refer to the attached documentation for further management of your symptoms.   Please return to the ER if you experience chest pain, trouble breathing, intractable nausea/vomiting or any other life threatening illnesses.

## 2024-06-13 LAB — GC/CHLAMYDIA PROBE AMP (~~LOC~~) NOT AT ARMC
Chlamydia: NEGATIVE
Comment: NEGATIVE
Comment: NORMAL
Neisseria Gonorrhea: POSITIVE — AB

## 2024-06-13 LAB — SYPHILIS: RPR W/REFLEX TO RPR TITER AND TREPONEMAL ANTIBODIES, TRADITIONAL SCREENING AND DIAGNOSIS ALGORITHM: RPR Ser Ql: NONREACTIVE

## 2024-06-20 ENCOUNTER — Ambulatory Visit (HOSPITAL_COMMUNITY): Payer: Self-pay

## 2024-06-26 ENCOUNTER — Encounter (HOSPITAL_COMMUNITY): Payer: Self-pay

## 2024-06-26 ENCOUNTER — Emergency Department (HOSPITAL_COMMUNITY): Admission: EM | Admit: 2024-06-26 | Discharge: 2024-06-26 | Disposition: A | Discharge status: Home / Self Care

## 2024-06-26 ENCOUNTER — Other Ambulatory Visit: Payer: Self-pay

## 2024-06-26 ENCOUNTER — Emergency Department (HOSPITAL_COMMUNITY)

## 2024-06-26 DIAGNOSIS — M5432 Sciatica, left side: Secondary | ICD-10-CM

## 2024-06-26 DIAGNOSIS — M5442 Lumbago with sciatica, left side: Secondary | ICD-10-CM | POA: Insufficient documentation

## 2024-06-26 LAB — PREGNANCY, URINE: Preg Test, Ur: NEGATIVE

## 2024-06-26 MED ORDER — CYCLOBENZAPRINE HCL 5 MG PO TABS: 5.0000 mg | ORAL_TABLET | Freq: Two times a day (BID) | ORAL | 0 refills | Status: AC | PRN

## 2024-06-26 MED ORDER — LIDOCAINE 5 % EX PTCH
1.0000 | MEDICATED_PATCH | CUTANEOUS | Status: DC
  Administered 2024-06-26: 1 via TRANSDERMAL
  Filled 2024-06-26: qty 1

## 2024-06-26 MED ORDER — LIDOCAINE 5 % EX PTCH: 1.0000 | MEDICATED_PATCH | CUTANEOUS | 0 refills | Status: AC

## 2024-06-26 MED ORDER — CYCLOBENZAPRINE HCL 10 MG PO TABS
5.0000 mg | ORAL_TABLET | Freq: Once | ORAL | Status: AC
  Administered 2024-06-26: 5 mg via ORAL
  Filled 2024-06-26: qty 1

## 2024-06-26 MED ORDER — ACETAMINOPHEN 500 MG PO TABS
1000.0000 mg | ORAL_TABLET | Freq: Once | ORAL | Status: AC
  Administered 2024-06-26: 1000 mg via ORAL
  Filled 2024-06-26: qty 2

## 2024-06-26 NOTE — ED Provider Triage Note (Signed)
 Emergency Medicine Provider Triage Evaluation Note  Samantha Bradford , a 39 y.o. female  was evaluated in triage.  Pt complains of left-sided low back pain extending along the lateral lower extremity with weightbearing that began upon waking this morning.  Denies any falls or injuries.  Took 2 of her prescribed gabapentin 3 hours ago without relief.  Pain is accompanied by numbness and tingling.  She denies history of sciatica.  Denies dysuria, hematuria, urinary frequency.  Review of Systems  Positive: Left-sided low back pain extending down the left lower extremity with weightbearing Negative: Urinary symptoms, falls, injuries  Physical Exam  BP 128/87 (BP Location: Left Arm)   Pulse 74   Temp 97.8 F (36.6 C)   Resp 18   SpO2 100%  Gen:   Awake, no distress.  Tearful on exam Resp:  Normal effort  MSK:   Limited mobility of left lower extremity secondary to pain Other:  Bilateral dorsal pedal pulses intact and equal.  Motor and sensory intact of left foot.  Patient able to stand and pain is reproducible with standing and palpation of left lower back.  Medical Decision Making  Medically screening exam initiated at 3:56 PM.  Appropriate orders placed.  Rosaura Bolon was informed that the remainder of the evaluation will be completed by another provider, this initial triage assessment does not replace that evaluation, and the importance of remaining in the ED until their evaluation is complete.   Rosina Almarie LABOR, PA-C 06/26/24 1559

## 2024-06-26 NOTE — ED Triage Notes (Signed)
 Pt c/o left lower back that radiates down left leg down to knee. Started yesterday. Pt states unable to stand. Pt c/o numbness and tingling in left foot.  Pt denies recent injury

## 2024-06-26 NOTE — ED Notes (Signed)
 Patient transported to X-ray
